# Patient Record
Sex: Male | Born: 1970 | Race: White | Hispanic: No | Marital: Single | Smoking: Current every day smoker
Health system: Southern US, Community
[De-identification: ages and names within clinical notes are randomized; demographics above are authoritative.]

## PROBLEM LIST (undated history)

## (undated) DIAGNOSIS — M810 Age-related osteoporosis without current pathological fracture: Secondary | ICD-10-CM

## (undated) DIAGNOSIS — I10 Essential (primary) hypertension: Secondary | ICD-10-CM

## (undated) DIAGNOSIS — F32A Depression, unspecified: Secondary | ICD-10-CM

## (undated) DIAGNOSIS — F909 Attention-deficit hyperactivity disorder, unspecified type: Secondary | ICD-10-CM

## (undated) DIAGNOSIS — F419 Anxiety disorder, unspecified: Secondary | ICD-10-CM

## (undated) DIAGNOSIS — J302 Other seasonal allergic rhinitis: Secondary | ICD-10-CM

## (undated) DIAGNOSIS — L309 Dermatitis, unspecified: Secondary | ICD-10-CM

## (undated) DIAGNOSIS — K219 Gastro-esophageal reflux disease without esophagitis: Secondary | ICD-10-CM

## (undated) DIAGNOSIS — E785 Hyperlipidemia, unspecified: Secondary | ICD-10-CM

## (undated) DIAGNOSIS — J45909 Unspecified asthma, uncomplicated: Secondary | ICD-10-CM

## (undated) HISTORY — PX: BONE GRAFT HIP ILIAC CREST: SUR159

## (undated) HISTORY — PX: WISDOM TOOTH EXTRACTION: SHX21

## (undated) HISTORY — PX: WRIST FRACTURE SURGERY: SHX121

## (undated) HISTORY — PX: TOTAL HIP ARTHROPLASTY: SHX124

## (undated) HISTORY — PX: OTHER SURGICAL HISTORY: SHX169

---

## 2004-08-08 ENCOUNTER — Ambulatory Visit: Payer: Self-pay | Admitting: Unknown Physician Specialty

## 2005-10-29 ENCOUNTER — Ambulatory Visit: Payer: Self-pay | Admitting: Orthopedic Surgery

## 2007-03-04 ENCOUNTER — Inpatient Hospital Stay: Payer: Self-pay | Admitting: General Practice

## 2007-04-15 ENCOUNTER — Ambulatory Visit: Payer: Self-pay | Admitting: Gastroenterology

## 2008-01-04 ENCOUNTER — Inpatient Hospital Stay: Payer: Self-pay | Admitting: Psychiatry

## 2008-01-11 ENCOUNTER — Ambulatory Visit: Payer: Self-pay | Admitting: Unknown Physician Specialty

## 2008-01-25 ENCOUNTER — Ambulatory Visit: Payer: Self-pay | Admitting: Unknown Physician Specialty

## 2008-02-25 ENCOUNTER — Ambulatory Visit: Payer: Self-pay | Admitting: Unknown Physician Specialty

## 2008-12-27 ENCOUNTER — Emergency Department: Payer: Self-pay | Admitting: Emergency Medicine

## 2010-05-08 ENCOUNTER — Ambulatory Visit: Payer: Self-pay | Admitting: Unknown Physician Specialty

## 2010-05-10 LAB — PATHOLOGY REPORT

## 2013-04-08 ENCOUNTER — Ambulatory Visit: Payer: Self-pay | Admitting: Orthopedic Surgery

## 2015-05-16 ENCOUNTER — Encounter: Payer: Self-pay | Admitting: Emergency Medicine

## 2015-05-16 ENCOUNTER — Emergency Department: Payer: BLUE CROSS/BLUE SHIELD

## 2015-05-16 ENCOUNTER — Inpatient Hospital Stay
Admission: EM | Admit: 2015-05-16 | Discharge: 2015-05-17 | DRG: 312 | Disposition: A | Discharge status: Home / Self Care | Payer: BLUE CROSS/BLUE SHIELD | Attending: Internal Medicine | Admitting: Internal Medicine

## 2015-05-16 DIAGNOSIS — S82209A Unspecified fracture of shaft of unspecified tibia, initial encounter for closed fracture: Secondary | ICD-10-CM | POA: Diagnosis present

## 2015-05-16 DIAGNOSIS — E871 Hypo-osmolality and hyponatremia: Secondary | ICD-10-CM

## 2015-05-16 DIAGNOSIS — Z880 Allergy status to penicillin: Secondary | ICD-10-CM | POA: Diagnosis not present

## 2015-05-16 DIAGNOSIS — S82201A Unspecified fracture of shaft of right tibia, initial encounter for closed fracture: Secondary | ICD-10-CM | POA: Diagnosis present

## 2015-05-16 DIAGNOSIS — E860 Dehydration: Secondary | ICD-10-CM

## 2015-05-16 DIAGNOSIS — Z7982 Long term (current) use of aspirin: Secondary | ICD-10-CM

## 2015-05-16 DIAGNOSIS — Z79891 Long term (current) use of opiate analgesic: Secondary | ICD-10-CM | POA: Diagnosis not present

## 2015-05-16 DIAGNOSIS — E785 Hyperlipidemia, unspecified: Secondary | ICD-10-CM | POA: Diagnosis present

## 2015-05-16 DIAGNOSIS — Z96642 Presence of left artificial hip joint: Secondary | ICD-10-CM | POA: Diagnosis present

## 2015-05-16 DIAGNOSIS — I951 Orthostatic hypotension: Secondary | ICD-10-CM | POA: Diagnosis not present

## 2015-05-16 DIAGNOSIS — F17210 Nicotine dependence, cigarettes, uncomplicated: Secondary | ICD-10-CM | POA: Diagnosis present

## 2015-05-16 DIAGNOSIS — S82401A Unspecified fracture of shaft of right fibula, initial encounter for closed fracture: Secondary | ICD-10-CM | POA: Diagnosis present

## 2015-05-16 DIAGNOSIS — R52 Pain, unspecified: Secondary | ICD-10-CM

## 2015-05-16 DIAGNOSIS — W19XXXA Unspecified fall, initial encounter: Secondary | ICD-10-CM | POA: Diagnosis present

## 2015-05-16 DIAGNOSIS — Z8249 Family history of ischemic heart disease and other diseases of the circulatory system: Secondary | ICD-10-CM | POA: Diagnosis not present

## 2015-05-16 DIAGNOSIS — Z8042 Family history of malignant neoplasm of prostate: Secondary | ICD-10-CM | POA: Diagnosis not present

## 2015-05-16 DIAGNOSIS — I10 Essential (primary) hypertension: Secondary | ICD-10-CM | POA: Diagnosis present

## 2015-05-16 DIAGNOSIS — Z79899 Other long term (current) drug therapy: Secondary | ICD-10-CM | POA: Diagnosis not present

## 2015-05-16 DIAGNOSIS — F909 Attention-deficit hyperactivity disorder, unspecified type: Secondary | ICD-10-CM | POA: Diagnosis present

## 2015-05-16 DIAGNOSIS — M810 Age-related osteoporosis without current pathological fracture: Secondary | ICD-10-CM | POA: Diagnosis present

## 2015-05-16 HISTORY — DX: Attention-deficit hyperactivity disorder, unspecified type: F90.9

## 2015-05-16 HISTORY — DX: Hyperlipidemia, unspecified: E78.5

## 2015-05-16 HISTORY — DX: Essential (primary) hypertension: I10

## 2015-05-16 HISTORY — DX: Age-related osteoporosis without current pathological fracture: M81.0

## 2015-05-16 LAB — COMPREHENSIVE METABOLIC PANEL
ALBUMIN: 3.7 g/dL (ref 3.5–5.0)
ALT: 38 U/L (ref 17–63)
ANION GAP: 4 — AB (ref 5–15)
AST: 29 U/L (ref 15–41)
Alkaline Phosphatase: 59 U/L (ref 38–126)
BILIRUBIN TOTAL: 0.4 mg/dL (ref 0.3–1.2)
BUN: 15 mg/dL (ref 6–20)
CO2: 23 mmol/L (ref 22–32)
Calcium: 8.1 mg/dL — ABNORMAL LOW (ref 8.9–10.3)
Chloride: 101 mmol/L (ref 101–111)
Creatinine, Ser: 1.21 mg/dL (ref 0.61–1.24)
GFR calc non Af Amer: >60 mL/min (ref >60)
GLUCOSE: 131 mg/dL — AB (ref 65–99)
POTASSIUM: 3.9 mmol/L (ref 3.5–5.1)
SODIUM: 128 mmol/L — AB (ref 135–145)
TOTAL PROTEIN: 6.7 g/dL (ref 6.5–8.1)

## 2015-05-16 LAB — CBC WITH DIFFERENTIAL/PLATELET
BASOS PCT: 0 %
Basophils Absolute: 0 10*3/uL (ref 0–0.1)
EOS ABS: 0 10*3/uL (ref 0–0.7)
Eosinophils Relative: 1 %
HEMATOCRIT: 44 % (ref 40.0–52.0)
HEMOGLOBIN: 14.9 g/dL (ref 13.0–18.0)
Lymphocytes Relative: 8 %
Lymphs Abs: 0.5 10*3/uL — ABNORMAL LOW (ref 1.0–3.6)
MCH: 31.5 pg (ref 26.0–34.0)
MCHC: 33.8 g/dL (ref 32.0–36.0)
MCV: 93.4 fL (ref 80.0–100.0)
MONOS PCT: 11 %
Monocytes Absolute: 0.7 10*3/uL (ref 0.2–1.0)
NEUTROS ABS: 5.1 10*3/uL (ref 1.4–6.5)
NEUTROS PCT: 80 %
Platelets: 161 10*3/uL (ref 150–440)
RBC: 4.72 MIL/uL (ref 4.40–5.90)
RDW: 13.8 % (ref 11.5–14.5)
WBC: 6.4 10*3/uL (ref 3.8–10.6)

## 2015-05-16 LAB — RAPID INFLUENZA A&B ANTIGENS (ARMC ONLY)
INFLUENZA A (ARMC): NEGATIVE
INFLUENZA B (ARMC): NEGATIVE

## 2015-05-16 LAB — INFLUENZA PANEL BY PCR (TYPE A & B)
H1N1 flu by pcr: NOT DETECTED
Influenza A By PCR: POSITIVE — AB
Influenza B By PCR: NEGATIVE

## 2015-05-16 LAB — LIPASE, BLOOD: Lipase: 20 U/L (ref 11–51)

## 2015-05-16 MED ORDER — POLYETHYLENE GLYCOL 3350 17 G PO PACK: 17.0000 g | PACK | Freq: Every day | ORAL | Status: DC | PRN

## 2015-05-16 MED ORDER — ONDANSETRON HCL 4 MG/2ML IJ SOLN
4.0000 mg | Freq: Four times a day (QID) | INTRAMUSCULAR | Status: DC | PRN
Start: 2015-05-16 — End: 2015-05-17

## 2015-05-16 MED ORDER — ACETAMINOPHEN 650 MG RE SUPP: 650.0000 mg | Freq: Four times a day (QID) | RECTAL | Status: DC | PRN

## 2015-05-16 MED ORDER — ENOXAPARIN SODIUM 40 MG/0.4ML ~~LOC~~ SOLN: 40.0000 mg | SUBCUTANEOUS | Status: DC

## 2015-05-16 MED ORDER — HYDROMORPHONE HCL 1 MG/ML IJ SOLN
1.0000 mg | INTRAMUSCULAR | Status: DC | PRN
  Administered 2015-05-16 – 2015-05-17 (×3): 1 mg via INTRAVENOUS
  Filled 2015-05-16 (×3): qty 1

## 2015-05-16 MED ORDER — ALPRAZOLAM 0.5 MG PO TABS
0.5000 mg | ORAL_TABLET | Freq: Three times a day (TID) | ORAL | Status: DC | PRN
  Administered 2015-05-17: 0.5 mg via ORAL
  Filled 2015-05-16: qty 1

## 2015-05-16 MED ORDER — HYDROMORPHONE HCL 1 MG/ML IJ SOLN
1.0000 mg | Freq: Once | INTRAMUSCULAR | Status: AC
  Administered 2015-05-16: 1 mg via INTRAVENOUS
  Filled 2015-05-16: qty 1

## 2015-05-16 MED ORDER — SODIUM CHLORIDE 0.9 % IV BOLUS (SEPSIS)
1000.0000 mL | Freq: Once | INTRAVENOUS | Status: AC
  Administered 2015-05-16: 1000 mL via INTRAVENOUS

## 2015-05-16 MED ORDER — MOMETASONE FURO-FORMOTEROL FUM 100-5 MCG/ACT IN AERO
2.0000 | INHALATION_SPRAY | Freq: Two times a day (BID) | RESPIRATORY_TRACT | Status: DC
  Administered 2015-05-16 – 2015-05-17 (×2): 2 via RESPIRATORY_TRACT
  Filled 2015-05-16: qty 8.8

## 2015-05-16 MED ORDER — OMEGA-3-ACID ETHYL ESTERS 1 G PO CAPS
1.0000 g | ORAL_CAPSULE | Freq: Every day | ORAL | Status: DC
Start: 2015-05-17 — End: 2015-05-17
  Administered 2015-05-17: 1 g via ORAL
  Filled 2015-05-16: qty 1

## 2015-05-16 MED ORDER — NICOTINE 21 MG/24HR TD PT24
21.0000 mg | MEDICATED_PATCH | Freq: Every day | TRANSDERMAL | Status: DC
  Administered 2015-05-16 – 2015-05-17 (×2): 21 mg via TRANSDERMAL
  Filled 2015-05-16 (×2): qty 1

## 2015-05-16 MED ORDER — HYDROCODONE-ACETAMINOPHEN 5-325 MG PO TABS
1.0000 | ORAL_TABLET | ORAL | Status: DC | PRN
  Administered 2015-05-16 – 2015-05-17 (×4): 2 via ORAL
  Filled 2015-05-16 (×4): qty 2

## 2015-05-16 MED ORDER — SODIUM CHLORIDE 0.9 % IV SOLN
INTRAVENOUS | Status: DC
  Administered 2015-05-16 (×2): via INTRAVENOUS

## 2015-05-16 MED ORDER — AMPHETAMINE-DEXTROAMPHET ER 5 MG PO CP24: 20.0000 mg | ORAL_CAPSULE | Freq: Every day | ORAL | Status: DC

## 2015-05-16 MED ORDER — ACETAMINOPHEN 325 MG PO TABS: 650.0000 mg | ORAL_TABLET | Freq: Four times a day (QID) | ORAL | Status: DC | PRN

## 2015-05-16 MED ORDER — SIMVASTATIN 20 MG PO TABS
20.0000 mg | ORAL_TABLET | Freq: Every day | ORAL | Status: DC
  Administered 2015-05-17: 20 mg via ORAL
  Filled 2015-05-16: qty 1

## 2015-05-16 MED ORDER — ONDANSETRON HCL 4 MG PO TABS: 4.0000 mg | ORAL_TABLET | Freq: Four times a day (QID) | ORAL | Status: DC | PRN

## 2015-05-16 MED ORDER — ALENDRONATE SODIUM 10 MG PO TABS: 10.0000 mg | ORAL_TABLET | Freq: Every day | ORAL | Status: DC

## 2015-05-16 MED ORDER — DOCUSATE SODIUM 100 MG PO CAPS
100.0000 mg | ORAL_CAPSULE | Freq: Two times a day (BID) | ORAL | Status: DC
  Administered 2015-05-17: 100 mg via ORAL
  Filled 2015-05-16: qty 1

## 2015-05-16 MED ORDER — ONDANSETRON HCL 4 MG/2ML IJ SOLN
4.0000 mg | Freq: Once | INTRAMUSCULAR | Status: AC
  Administered 2015-05-16: 4 mg via INTRAVENOUS
  Filled 2015-05-16: qty 2

## 2015-05-16 NOTE — ED Provider Notes (Signed)
[facility] Emergency Department Provider Note  ____________________________________________  Time seen: 12:45 PM on arrival by EMS  I have reviewed the triage vital signs and the nursing notes.   HISTORY  Chief Complaint Leg Injury    HPI Theodore Garcia is a 45 y.o. male who complains of syncope and right lower leg pain today. He reports that he has had vomiting and diarrhea and an inability to eat or drink anything for the past 4 or 5 days. This morning he was getting out of bed to go to the bathroom, and as he was turning while standing on his feet he passed out. He woke up on the ground and had severe right leg pain and inability to move the foot. When EMS found him, initial blood pressure was about 80/60. This improved to 110/70 with IV fluids en route.  The leg pain is 10 out of 10, worse with movement. Denies numbness or tingling in the foot.   Past Medical History  Diagnosis Date  . Hypertension      There are no active problems to display for this patient.    Past Surgical History  Procedure Laterality Date  . Bone graph    . Total hip arthroplasty      Left hip     No current outpatient prescriptions on file.   Allergies Penicillins   No family history on file.  Social History Social History  Substance Use Topics  . Smoking status: Unknown If Ever Smoked  . Smokeless tobacco: Not on file  . Alcohol Use: Not on file   no current alcohol or tobacco use.  Review of Systems  Constitutional:   No fever or chills. No weight changes Eyes:   No blurry vision or double vision.  ENT:   No sore throat.  Cardiovascular:   No chest pain. Respiratory:   No dyspnea or cough. Gastrointestinal:   Generalized abdominal pain with vomiting and diarrhea..  No BRBPR or melena. Genitourinary:   Negative for dysuria or difficulty urinating. Musculoskeletal:   Negative for back pain. Right lower leg pain as above. Skin:   Negative for  rash. Neurological:   Negative for headaches, focal weakness or numbness. Positive syncope. Psychiatric:  No anxiety or depression.   Endocrine:  No changes in energy or sleep difficulty.  10-point ROS otherwise negative.  ____________________________________________   PHYSICAL EXAM:  VITAL SIGNS: ED Triage Vitals  Enc Vitals Group     BP 05/16/15 1258 109/70 mmHg     Pulse Rate 05/16/15 1258 69     Resp 05/16/15 1258 18     Temp 05/16/15 1258 97.5 F (36.4 C)     Temp Source 05/16/15 1258 Oral     SpO2 05/16/15 1258 100 %     Weight --      Height --      Head Cir --      Peak Flow --      Pain Score 05/16/15 1259 10     Pain Loc --      Pain Edu? --      Excl. in GC? --     Vital signs reviewed, nursing assessments reviewed.   Constitutional:   Alert and oriented. Very uncomfortable.. Eyes:   No scleral icterus. No conjunctival pallor. PERRL. EOMI ENT   Head:   Normocephalic and atraumatic.   Nose:   No congestion/rhinnorhea. No septal hematoma   Mouth/Throat:   Dry mucous membranes, no pharyngeal erythema. No peritonsillar  mass.    Neck:   No stridor. No SubQ emphysema. No meningismus. Hematological/Lymphatic/Immunilogical:   No cervical lymphadenopathy. Cardiovascular:   RRR. Symmetric bilateral radial, PT, and DP pulses. The lower extremity pulses are thready but symmetric between the injured and an injured legs. Capillary refill is sluggish at about 4 or 5 seconds laterally. No heart murmurs.  Respiratory:   Normal respiratory effort without tachypnea nor retractions. Breath sounds are clear and equal bilaterally. No wheezes/rales/rhonchi. Gastrointestinal:   Soft and nontender. Non distended.  No rebound, rigidity, or guarding. Genitourinary:   deferred Musculoskeletal:   Lateral rotation of the right foot and ankle compared to the knee and thigh. There is severe bony tenderness throughout the tib-fib area. Neurologic:   Normal speech and  language.  CN 2-10 normal. Motor grossly intact. Able to wiggle toes on the right foot Intact sensation No gross focal neurologic deficits are appreciated.  Skin:    Skin is warm, dry and intact. No rash noted.  No petechiae, purpura, or bullae. Psychiatric:   Mood and affect are normal. ____________________________________________    LABS (pertinent positives/negatives) (all labs ordered are listed, but only abnormal results are displayed) Labs Reviewed  COMPREHENSIVE METABOLIC PANEL - Abnormal; Notable for the following:    Sodium 128 (*)    Glucose, Bld 131 (*)    Calcium 8.1 (*)    Anion gap 4 (*)    All other components within normal limits  CBC WITH DIFFERENTIAL/PLATELET - Abnormal; Notable for the following:    Lymphs Abs 0.5 (*)    All other components within normal limits  RAPID INFLUENZA A&B ANTIGENS ([facility] ONLY)  LIPASE, BLOOD   ____________________________________________   EKG    ____________________________________________    RADIOLOGY  X-ray right tib-fib shows a comminuted displaced fracture of the proximal right fibula and a displaced spiral fracture of the distal right tibia.  ____________________________________________   PROCEDURES SPLINT APPLICATION Date/Time: 3:10 PM Authorized by: Scotty Court, Aaniyah Strohm Consent: Verbal consent obtained. Risks and benefits: risks, benefits and alternatives were discussed Consent given by: patient Splint applied by: Myself and orthopedic technician Location details: Right leg  Splint type: Long leg 3 sided  Supplies used: Ortho-Glass  Post-procedure: The splinted body part was neurovascularly unchanged following the procedure. Patient tolerance: Patient tolerated the procedure well with no immediate complications.     ____________________________________________   INITIAL IMPRESSION / ASSESSMENT AND PLAN / ED COURSE  Pertinent labs & imaging results that were available during my care of the patient were  reviewed by me and considered in my medical decision making (see chart for details).  Patient presents with hypotension secondary to severe dehydration from a vomiting and diarrheal illness, likely viral. We'll check a flu test will giving IV fluids. He does seem to be volume responsive. Low suspicion for sepsis or shock at this time. This also appears to cause syncope due to orthostatic hypotension today. It while he was passing out he sustained a displaced tib-fib fracture of the right leg. I discussed the injury with Dr. Hyacinth Meeker who is on-call for orthopedics at 2:15 PM. He states that this can be splinted and followed up in clinic and he like to see the patient in 2 days on Friday. On reassessing the patient at 2:40 PM, after his initial liter bolus of saline and had finished, blood pressure had again dropped to 80/60 and the patient still had severe 10 out of 10 pain. We'll give a repeat dose of Dilaudid as well as another  liter of saline bolus.  Due to the persistent hypotension secondary to dehydration, comminuted by hyponatremia on labs, patient living alone, and his inability to care for himself if he is not able to safely ambulate with crutches, I discussed the case with the hospitalist for admission. We'll continue giving Dilaudid as needed for his intractable leg pain secondary to acute fractures. Initial fracture management provided with pain control and splinting.     ____________________________________________   FINAL CLINICAL IMPRESSION(S) / ED DIAGNOSES  Final diagnoses:  Orthostatic hypotension  Severe dehydration  Hyponatremia  Intractable pain  Closed fracture of right tibia and fibula, initial encounter      Sharman CheekPhillip Humbert Morozov, MD 05/16/15 1513

## 2015-05-16 NOTE — ED Notes (Signed)
Per EMS, patient comes from home. He has been sick since Saturday. Today he got OOB and "the next thing I knew I was on the ground". Patient has hx of avascular necrosis of right leg. EMS noticed an obvious deformity to the right skin and placed patient in a splint. Lower extremity pulses are thready but symmetrical on both sides. Cap refill is sluggish but also symmetrical. Patient rates his pain 10/10.

## 2015-05-16 NOTE — Progress Notes (Signed)
PHARMACIST - PHYSICIAN COMMUNICATION  CONCERNING: P&T Medication Policy Regarding Oral Bisphosphonates  RECOMMENDATION: Your order for alendronate (Fosamax), ibandronate (Boniva), or risedronate (Actonel) has been discontinued at this time.  If the patient's post-hospital medical condition warrants safe use of this class of drugs, please resume the pre-hospital regimen upon discharge.  DESCRIPTION:  Alendronate (Fosamax), ibandronate (Boniva), and risedronate (Actonel) can cause severe esophageal erosions in patients who are unable to remain upright at least 30 minutes after taking this medication.   Since brief interruptions in therapy are thought to have minimal impact on bone mineral density, the Pharmacy & Therapeutics Committee has established that bisphosphonate orders should be routinely discontinued during hospitalization.   To override this safety policy and permit administration of Boniva, Fosamax, or Actonel in the hospital, prescribers must write "DO NOT HOLD" in the comments section when placing the order for this class of medications.  Jodelle RedMary M [facility] 05/16/15 6:12 PM  .

## 2015-05-16 NOTE — H&P (Signed)
[facility] [place] [facility]   PATIENT NAME: Theodore PeekCharles Garcia    MR#:  [id number]  DATE OF BIRTH:  01/14/1971  DATE OF ADMISSION:  05/16/2015  PRIMARY CARE PHYSICIAN: Dr. Jerl MinaJames Hedrick  REQUESTING/REFERRING PHYSICIAN: Dr. Sharman CheekPhillip Stafford  CHIEF COMPLAINT:   Chief Complaint  Patient presents with  . Leg Injury    Right    HISTORY OF PRESENT ILLNESS:  Theodore Garcia  is a 45 y.o. 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 Patient said he started feeling sick about 3-4 days ago. Started with chills, body aches and nausea. He had some sore throat and nasal congestion. He has been not eating and drinking well since then. Since been feeling weak unable to get out of bed for the last couple of days. Today was the first day that he felt little stronger and wanted to get up and take his medications and, as soon as he stood up moving he felt dizzy and passed out. When he woke up he noticed that his right leg was extremely painful and swollen. He still dragged himself to the bathroom because he was having diarrhea and had a second syncopal episode over there. His mom who came to check on him brought into the hospital. Was noted to be hypotensive in the emergency room, responding to IV fluids. Also x-ray showing right tibial and fibular fracture.  PAST MEDICAL HISTORY:   Past Medical History  Diagnosis Date  . Hypertension   . Hyperlipidemia   . ADHD (attention deficit hyperactivity disorder)   . Osteoporosis     PAST SURGICAL HISTORY:   Past Surgical History  Procedure Laterality Date  . Total hip arthroplasty      Left hip  . Bone graft hip iliac crest      left hip  . Craniotomy      for closed fontanella  . Wrist fracture surgery      Right wrist    SOCIAL HISTORY:    Social History  Substance Use Topics  . Smoking status: Current Every Day Smoker -- 1.00 packs/day    Types: Cigarettes  . Smokeless tobacco: Not on file  . Alcohol Use: 0.0 oz/week    0 Standard drinks or equivalent per week     Comment: liquor 2-3 times/day- last drink 4 days ago    FAMILY HISTORY:   Family History  Problem Relation Age of Onset  . CAD Mother     had CABG  . Prostate cancer Maternal Grandfather     DRUG ALLERGIES:   Allergies  Allergen Reactions  . Penicillins Swelling, Rash and Other (See Comments)    Reaction:  Tongue swelling  Has patient had a PCN reaction causing immediate rash, facial/tongue/throat swelling, SOB or lightheadedness with hypotension: Yes Has patient had a PCN reaction causing severe rash involving mucus membranes or skin necrosis: No Has patient had a PCN reaction that required hospitalization No Has patient had a PCN reaction occurring within the last 10 years: No If all of the above answers are "NO", then may proceed with Cephalosporin use.    REVIEW OF SYSTEMS:   Review of Systems  Constitutional: Positive for malaise/fatigue. Negative for fever, chills and weight loss.  HENT: Negative for ear discharge, ear pain, hearing loss and nosebleeds.   Eyes: Negative for blurred  vision, double vision and photophobia.  Respiratory: Negative for cough, hemoptysis, shortness of breath and wheezing.   Cardiovascular: Negative for chest pain, palpitations, orthopnea and leg swelling.  Gastrointestinal: Positive for nausea. Negative for heartburn, vomiting, abdominal pain, diarrhea, constipation and melena.  Genitourinary: Negative for dysuria, urgency and frequency.  Musculoskeletal: Positive for myalgias and joint pain. Negative for back pain and neck pain.  Skin: Negative for rash.  Neurological: Negative for dizziness, tingling, tremors, sensory change, speech change, focal weakness and headaches.  Endo/Heme/Allergies: Does not  bruise/bleed easily.  Psychiatric/Behavioral: Negative for depression.    MEDICATIONS AT HOME:   Prior to Admission medications   Medication Sig Start Date End Date Taking? Authorizing Provider  alendronate (FOSAMAX) 10 MG tablet Take 10 mg by mouth daily with lunch. Take with a full glass of water on an empty stomach.   Yes Historical Provider, MD  ALPRAZolam Prudy Feeler) 0.5 MG tablet Take 0.5 mg by mouth 3 (three) times daily as needed for anxiety or sleep.   Yes Historical Provider, MD  amphetamine-dextroamphetamine (ADDERALL XR) 20 MG 24 hr capsule Take 20 mg by mouth daily at 12 noon.   Yes Historical Provider, MD  amphetamine-dextroamphetamine (ADDERALL) 10 MG tablet Take 10 mg by mouth daily with lunch.   Yes Historical Provider, MD  Aspirin-Salicylamide-Caffeine (BC HEADACHE PO) Take 1 packet by mouth as needed (for headache).   Yes Historical Provider, MD  budesonide-formoterol (SYMBICORT) 80-4.5 MCG/ACT inhaler Inhale 2 puffs into the lungs 2 (two) times daily.   Yes Historical Provider, MD  EPINEPHrine (EPIPEN 2-PAK) 0.3 mg/0.3 mL IJ SOAJ injection Inject 0.3 mg into the muscle once as needed (for severe allergic reaction).   Yes Historical Provider, MD  lisinopril (PRINIVIL,ZESTRIL) 20 MG tablet Take 20 mg by mouth daily at 12 noon.   Yes Historical Provider, MD  omega-3 acid ethyl esters (LOVAZA) 1 g capsule Take 1 g by mouth daily at 12 noon.   Yes Historical Provider, MD  simvastatin (ZOCOR) 20 MG tablet Take 20 mg by mouth daily at 12 noon.   Yes Historical Provider, MD  Tuberculin-Allergy Syringes (B-D ALLERGY SYRINGE 1CC/28G) 28G X 1/2" 1 ML MISC 1 Dose by Does not apply route once a week.   Yes Historical Provider, MD      VITAL SIGNS:  Blood pressure 109/70, pulse 65, temperature 97.5 F (36.4 C), temperature source Oral, resp. rate 12, SpO2 96 %.  PHYSICAL EXAMINATION:   Physical Exam  GENERAL:  45 y.o.-year-old patient lying in the bed with no acute distress.  EYES:  Pupils equal, round, reactive to light and accommodation. No scleral icterus. Extraocular muscles intact. Erythematous conjunctiva HEENT: Head atraumatic, normocephalic. Oropharynx and nasopharynx clear. Very dry mucous membranes NECK:  Supple, no jugular venous distention. No thyroid enlargement, no tenderness.  LUNGS: Normal breath sounds bilaterally, no wheezing, rales,rhonchi or crepitation. No use of accessory muscles of respiration.  CARDIOVASCULAR: S1, S2 normal. No murmurs, rubs, or gallops.  ABDOMEN: Soft, nontender, nondistended. Bowel sounds present. No organomegaly or mass.  EXTREMITIES: Right leg in a long leg soft splint starting mid thigh to the foot. No pedal edema, cyanosis, or clubbing.  NEUROLOGIC: Cranial nerves II through XII are intact. Muscle strength 5/5 in all extremities except right leg due to pain. Sensation intact. Gait not checked.  PSYCHIATRIC: The patient is alert and oriented x 3.  SKIN: No obvious rash, lesion, or ulcer.   LABORATORY PANEL:   CBC  Recent Labs Lab 05/16/15 1343  WBC 6.4  HGB 14.9  HCT 44.0  PLT 161   ------------------------------------------------------------------------------------------------------------------  Chemistries   Recent Labs Lab 05/16/15 1343  NA 128*  K 3.9  CL 101  CO2 23  GLUCOSE 131*  BUN 15  CREATININE 1.21  CALCIUM 8.1*  AST 29  ALT 38  ALKPHOS 59  BILITOT 0.4   ------------------------------------------------------------------------------------------------------------------  Cardiac Enzymes No results for input(s): TROPONINI in the last 168 hours. ------------------------------------------------------------------------------------------------------------------  RADIOLOGY:  Dg Tibia/fibula Right  05/16/2015  CLINICAL DATA:  Flu symptoms for 1 week. Passed out today. Leg pain. EXAM: RIGHT TIBIA AND FIBULA - 2 VIEW COMPARISON:  Right femur 12/27/2008 FINDINGS: Comminuted proximal right fibular  diaphysis fracture with 7 mm of lateral displacement. Spiral fracture of the distal right tibial diaphysis with 9 mm of lateral displacement. No other fracture or dislocation. Serpiginous sclerotic lesion in the distal femoral metaphysis without endosteal scalloping, bone destruction or soft tissue mass most consistent with a bone infarct. IMPRESSION: Comminuted proximal right fibular diaphysis fracture with 7 mm of lateral displacement. Spiral fracture of the distal right tibial diaphysis with 9 mm of lateral displacement. Electronically Signed   By: Elige Ko   On: 05/16/2015 13:28    EKG:   Orders placed or performed during the hospital encounter of 05/16/15  . ED EKG  . ED EKG    IMPRESSION AND PLAN:   Deni Lefever  is a 45 y.o. 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  #1 syncope-Vasovagal and also likely orthostatic from hypotension. -No further testing needs to be done at this time. IV fluids and monitor  #2 hypotension-Hypovolemic. Poor by mouth intake over the last few days. IV hydration -Monitor blood pressure. Hold antihypertensives  #3 right leg tibial/fibular fracture-Orthopedics was contacted from the emergency room and they recommended outpatient follow-up and long splint. -Soft splint in place. Likely nonweightbearing. Ortho consult in the hospital. -Pain management. Physical therapy consult  #4 hypertension- Hold his home medications as patient is hypotensive now  #5 osteoporosis-continue Fosamax. 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 Patient on Adderall  #7 DVT prophylaxis-since appears to be nonsurgical at this time, we'll start Lovenox.    All the records are reviewed and case discussed with ED  provider. Management plans discussed with the patient, family and they are in agreement.  CODE STATUS: Full code  TOTAL TIME TAKING CARE OF THIS PATIENT: 50 minutes.    Enid Baas M.D on 05/16/2015 at 4:09 PM  Between 7am to 6pm - Pager - [phone]  After 6pm go to [email or web address] - password [email or web address] [facility]  [facility]  Office  [phone]  CC: Primary care physician; No primary care provider on file.

## 2015-05-16 NOTE — ED Notes (Signed)
Patient transported to x-ray. ?

## 2015-05-16 NOTE — Progress Notes (Signed)
Dr. Anne HahnWillis notified that pr is positive for FLU A. Droplet precautions continued. No new orders.

## 2015-05-17 ENCOUNTER — Other Ambulatory Visit: Payer: Self-pay | Admitting: Specialist

## 2015-05-17 LAB — BASIC METABOLIC PANEL
Anion gap: 5 (ref 5–15)
BUN: 12 mg/dL (ref 6–20)
CALCIUM: 7.9 mg/dL — AB (ref 8.9–10.3)
CO2: 27 mmol/L (ref 22–32)
CREATININE: 0.92 mg/dL (ref 0.61–1.24)
Chloride: 101 mmol/L (ref 101–111)
Glucose, Bld: 106 mg/dL — ABNORMAL HIGH (ref 65–99)
Potassium: 3.4 mmol/L — ABNORMAL LOW (ref 3.5–5.1)
SODIUM: 133 mmol/L — AB (ref 135–145)

## 2015-05-17 LAB — URINE DRUG SCREEN, QUALITATIVE (ARMC ONLY)
Amphetamines, Ur Screen: NOT DETECTED
BENZODIAZEPINE, UR SCRN: POSITIVE — AB
Barbiturates, Ur Screen: NOT DETECTED
Cannabinoid 50 Ng, Ur ~~LOC~~: POSITIVE — AB
Cocaine Metabolite,Ur ~~LOC~~: NOT DETECTED
MDMA (Ecstasy)Ur Screen: NOT DETECTED
Methadone Scn, Ur: NOT DETECTED
Opiate, Ur Screen: POSITIVE — AB
PHENCYCLIDINE (PCP) UR S: NOT DETECTED
TRICYCLIC, UR SCREEN: NOT DETECTED

## 2015-05-17 LAB — CBC
HCT: 38.7 % — ABNORMAL LOW (ref 40.0–52.0)
Hemoglobin: 13.1 g/dL (ref 13.0–18.0)
MCH: 31.7 pg (ref 26.0–34.0)
MCHC: 33.8 g/dL (ref 32.0–36.0)
MCV: 93.9 fL (ref 80.0–100.0)
PLATELETS: 160 10*3/uL (ref 150–440)
RBC: 4.12 MIL/uL — ABNORMAL LOW (ref 4.40–5.90)
RDW: 13.6 % (ref 11.5–14.5)
WBC: 4.4 10*3/uL (ref 3.8–10.6)

## 2015-05-17 MED ORDER — ALUM & MAG HYDROXIDE-SIMETH 200-200-20 MG/5ML PO SUSP
30.0000 mL | Freq: Four times a day (QID) | ORAL | Status: DC | PRN
  Administered 2015-05-17 (×2): 30 mL via ORAL
  Filled 2015-05-17 (×2): qty 30

## 2015-05-17 MED ORDER — ASPIRIN EC 325 MG PO TBEC: 325.0000 mg | DELAYED_RELEASE_TABLET | Freq: Every day | ORAL | Status: DC

## 2015-05-17 MED ORDER — OXYCODONE HCL 5 MG PO TABS: 5.0000 mg | ORAL_TABLET | ORAL | Status: DC | PRN

## 2015-05-17 NOTE — Consult Note (Signed)
ORTHOPAEDIC CONSULTATION  REQUESTING PHYSICIAN: Enedina Finner, MD  Chief Complaint: Right leg pain   HPI: Theodore Garcia is a 45 y.o. male who complains of who has been ill with the flu all week and became weak and dehydrated.  Stood up yesterday and collapsed injuring the right leg. Exam and x-rays in the ER showed a displaced and rotated right tibial/fibula fracture.  I recommended splinting and delayed rodding of the fracture to allow him to recover medically.  He had business to attend to next week and he would prefer to wait on surgery also.  The risks and benefits of surgery vs casting were discussed at length with him and his mother.  Thery agree with this plan and we will schedule him for surgery 05/23/15.   Past Medical History  Diagnosis Date  . Hypertension   . Hyperlipidemia   . ADHD (attention deficit hyperactivity disorder)   . Osteoporosis    Past Surgical History  Procedure Laterality Date  . Total hip arthroplasty      Left hip  . Bone graft hip iliac crest      left hip  . Craniotomy      for closed fontanella  . Wrist fracture surgery      Right wrist   Social History   Social History  . Marital Status: Single    Spouse Name: N/A  . Number of Children: N/A  . Years of Education: N/A   Social History Main Topics  . Smoking status: Current Every Day Smoker -- 1.00 packs/day    Types: Cigarettes  . Smokeless tobacco: None  . Alcohol Use: 0.0 oz/week    0 Standard drinks or equivalent per week     Comment: liquor 2-3 times/day- last drink 4 days ago  . Drug Use: Yes     Comment: inhales marijuana- last dip 4 days ago  . Sexual Activity: Not Asked   Other Topics Concern  . None   Social History Narrative   Lives alone.   Family History  Problem Relation Age of Onset  . CAD Mother     had CABG  . Prostate cancer Maternal Grandfather    Allergies  Allergen Reactions  . Penicillins Swelling, Rash and Other (See Comments)    Reaction:  Tongue  swelling  Has patient had a PCN reaction causing immediate rash, facial/tongue/throat swelling, SOB or lightheadedness with hypotension: Yes Has patient had a PCN reaction causing severe rash involving mucus membranes or skin necrosis: No Has patient had a PCN reaction that required hospitalization No Has patient had a PCN reaction occurring within the last 10 years: No If all of the above answers are "NO", then may proceed with Cephalosporin use.   Prior to Admission medications   Medication Sig Start Date End Date Taking? Authorizing Provider  alendronate (FOSAMAX) 10 MG tablet Take 10 mg by mouth daily with lunch. Take with a full glass of water on an empty stomach.   Yes Historical Provider, MD  ALPRAZolam Prudy Feeler) 0.5 MG tablet Take 0.5 mg by mouth 3 (three) times daily as needed for anxiety or sleep.   Yes Historical Provider, MD  amphetamine-dextroamphetamine (ADDERALL XR) 20 MG 24 hr capsule Take 20 mg by mouth daily at 12 noon.   Yes Historical Provider, MD  amphetamine-dextroamphetamine (ADDERALL) 10 MG tablet Take 10 mg by mouth daily with lunch.   Yes Historical Provider, MD  Aspirin-Salicylamide-Caffeine (BC HEADACHE PO) Take 1 packet by mouth as needed (for headache).  Yes Historical Provider, MD  budesonide-formoterol (SYMBICORT) 80-4.5 MCG/ACT inhaler Inhale 2 puffs into the lungs 2 (two) times daily.   Yes Historical Provider, MD  EPINEPHrine (EPIPEN 2-PAK) 0.3 mg/0.3 mL IJ SOAJ injection Inject 0.3 mg into the muscle once as needed (for severe allergic reaction).   Yes Historical Provider, MD  lisinopril (PRINIVIL,ZESTRIL) 20 MG tablet Take 20 mg by mouth daily at 12 noon.   Yes Historical Provider, MD  omega-3 acid ethyl esters (LOVAZA) 1 g capsule Take 1 g by mouth daily at 12 noon.   Yes Historical Provider, MD  simvastatin (ZOCOR) 20 MG tablet Take 20 mg by mouth daily at 12 noon.   Yes Historical Provider, MD  Tuberculin-Allergy Syringes (B-D ALLERGY SYRINGE 1CC/28G) 28G X  1/2" 1 ML MISC 1 Dose by Does not apply route once a week.   Yes Historical Provider, MD  aspirin EC 325 MG tablet Take 1 tablet (325 mg total) by mouth daily. Start taking daily but stop after your dose on Sunday march 26 th for your surgery on next wednesday 05/17/15   Enedina FinnerSona Patel, MD  oxyCODONE (ROXICODONE) 5 MG immediate release tablet Take 1 tablet (5 mg total) by mouth every 4 (four) hours as needed for severe pain. 05/17/15   Enedina FinnerSona Patel, MD   Dg Tibia/fibula Right  05/16/2015  CLINICAL DATA:  Flu symptoms for 1 week. Passed out today. Leg pain. EXAM: RIGHT TIBIA AND FIBULA - 2 VIEW COMPARISON:  Right femur 12/27/2008 FINDINGS: Comminuted proximal right fibular diaphysis fracture with 7 mm of lateral displacement. Spiral fracture of the distal right tibial diaphysis with 9 mm of lateral displacement. No other fracture or dislocation. Serpiginous sclerotic lesion in the distal femoral metaphysis without endosteal scalloping, bone destruction or soft tissue mass most consistent with a bone infarct. IMPRESSION: Comminuted proximal right fibular diaphysis fracture with 7 mm of lateral displacement. Spiral fracture of the distal right tibial diaphysis with 9 mm of lateral displacement. Electronically Signed   By: Elige KoHetal  Patel   On: 05/16/2015 13:28    Positive ROS: All other systems have been reviewed and were otherwise negative with the exception of those mentioned in the HPI and as above.  Physical Exam: General: Alert, no acute distress Cardiovascular: No pedal edema Respiratory: No cyanosis, no use of accessory musculature GI: No organomegaly, abdomen is soft and non-tender Skin: No lesions in the area of chief complaint Neurologic: Sensation intact distally Psychiatric: Patient is competent for consent with normal mood and affect Lymphatic: No axillary or cervical lymphadenopathy  MUSCULOSKELETAL: Right leg encased in posterior and medial/lateral splints.  csm good.  Right lower tibia and foot  externally rotated 45*.  Knee and hip normal.  No other injuries noted.  Splints left intact.   Assessment: Displaced right tibia/fibula fractures  Plan: Right tibial rodding next week May go home now and elevate the leg.     Valinda HoarMILLER,Valentine Kuechle E, MD [phone]   05/17/2015 5:02 PM

## 2015-05-17 NOTE — Care Management (Signed)
Standard tall walker requested from [facility]. They only have rolling walkers in stock so this will have to be shipped to patient's address- takes about 24-72 hours (by Saturday) depending on when order is put in system.

## 2015-05-17 NOTE — Discharge Summary (Signed)
[facility] - [place] at [facility]   PATIENT NAME: Theodore Garcia    MR#:  [id number]  DATE OF BIRTH:  September 08, 1970  DATE OF ADMISSION:  05/16/2015 ADMITTING PHYSICIAN: Enid Baas, MD  DATE OF DISCHARGE: 05/17/15  PRIMARY CARE PHYSICIAN: No primary care provider on file.    ADMISSION DIAGNOSIS:  Orthostatic hypotension [I95.1] Hyponatremia [E87.1] Severe dehydration [E86.0] Intractable pain [R52] Closed fracture of right tibia and fibula, initial encounter [S82.91XA]  DISCHARGE DIAGNOSIS:  Closed fracture of tibia-fibula status post mechanical fall. Patient scheduled to have surgery next Wednesday Polysubstance abuse Hypotension improved Hyponatremia due to dehydration improved  SECONDARY DIAGNOSIS:   Past Medical History  Diagnosis Date  . Hypertension   . Hyperlipidemia   . ADHD (attention deficit hyperactivity disorder)   . Osteoporosis     HOSPITAL COURSE:  Sergey Ishler is a 45 y.o. 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  #1 syncope-Vasovagal and also likely orthostatic from hypotension. -No further testing needs to be done at this time. -Patient's blood pressure improved he is asymptomatic. I have asked him to hold his lisinopril for now.  #2 hypotension-Hypovolemic. Poor by mouth intake over the last few days. IV hydration -Monitor blood pressure. Hold antihypertensives -Improved symptomatically hold antihypertensives for now  #3 right leg tibial/fibular fracture-Orthopedics was contacted from the emergency room and they recommended outpatient follow-up and long splint. -Patient was seen by Dr. Hyacinth Meeker who has scheduled his surgery outpatient next Wednesday -Patient will be on aspirin buffered 325 by mouth daily. He'll stop it on Sunday for his  surgery on Wednesday -Pain management.  -Physical therapy consult for crutches  #4 hypertension- Hold his home medications  -Blood pressure is improved since admission  #5 osteoporosis-continue Fosamax. 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 Patient on Adderall  #7 DVT prophylaxis-since appears to be nonsurgical at this time, we'll start Lovenox.  #8 positive influenza test. Patient has been out of the window with his symptoms. He is improved no fever no signs of fever. No indication for treatment.  Will discharge patient to home spoke with mother was agreeable to CONSULTS OBTAINED:  Treatment Team:  Deeann Saint, MD  DRUG ALLERGIES:   Allergies  Allergen Reactions  . Penicillins Swelling, Rash and Other (See Comments)    Reaction:  Tongue swelling  Has patient had a PCN reaction causing immediate rash, facial/tongue/throat swelling, SOB or lightheadedness with hypotension: Yes Has patient had a PCN reaction causing severe rash involving mucus membranes or skin necrosis: No Has patient had a PCN reaction that required hospitalization No Has patient had a PCN reaction occurring within the last 10 years: No If all of the above answers are "NO", then may proceed with Cephalosporin use.    DISCHARGE MEDICATIONS:   Current Discharge Medication List    START taking these medications   Details  aspirin EC 325 MG tablet Take 1 tablet (325 mg total) by mouth daily. Start taking daily but stop after your dose on Sunday march 26 th for your surgery on next wednesday Qty: 30 tablet, Refills: 0    oxyCODONE (ROXICODONE) 5 MG immediate release tablet Take 1 tablet (5 mg total) by mouth every 4 (four) hours as needed for severe pain. Qty: 30 tablet, Refills: 0  CONTINUE these medications which have NOT CHANGED   Details  alendronate (FOSAMAX) 10 MG tablet Take 10 mg by mouth daily with lunch. Take with  a full glass of water on an empty stomach.    ALPRAZolam (XANAX) 0.5 MG tablet Take 0.5 mg by mouth 3 (three) times daily as needed for anxiety or sleep.    amphetamine-dextroamphetamine (ADDERALL XR) 20 MG 24 hr capsule Take 20 mg by mouth daily at 12 noon.    amphetamine-dextroamphetamine (ADDERALL) 10 MG tablet Take 10 mg by mouth daily with lunch.    budesonide-formoterol (SYMBICORT) 80-4.5 MCG/ACT inhaler Inhale 2 puffs into the lungs 2 (two) times daily.    EPINEPHrine (EPIPEN 2-PAK) 0.3 mg/0.3 mL IJ SOAJ injection Inject 0.3 mg into the muscle once as needed (for severe allergic reaction).    omega-3 acid ethyl esters (LOVAZA) 1 g capsule Take 1 g by mouth daily at 12 noon.    simvastatin (ZOCOR) 20 MG tablet Take 20 mg by mouth daily at 12 noon.    Tuberculin-Allergy Syringes (B-D ALLERGY SYRINGE 1CC/28G) 28G X 1/2" 1 ML MISC 1 Dose by Does not apply route once a week.      STOP taking these medications     Aspirin-Salicylamide-Caffeine (BC HEADACHE PO)      lisinopril (PRINIVIL,ZESTRIL) 20 MG tablet         If you experience worsening of your admission symptoms, develop shortness of breath, life threatening emergency, suicidal or homicidal thoughts you must seek medical attention immediately by calling 911 or calling your MD immediately  if symptoms less severe.  You Must read complete instructions/literature along with all the possible adverse reactions/side effects for all the Medicines you take and that have been prescribed to you. Take any new Medicines after you have completely understood and accept all the possible adverse reactions/side effects.   Please note  You were cared for by a hospitalist during your hospital stay. If you have any questions about your discharge medications or the care you received while you were in the hospital after you are discharged, you can call the unit and asked to speak with the hospitalist on call if the hospitalist that took care of  you is not available. Once you are discharged, your primary care physician will handle any further medical issues. Please note that NO REFILLS for any discharge medications will be authorized once you are discharged, as it is imperative that you return to your primary care physician (or establish a relationship with a primary care physician if you do not have one) for your aftercare needs so that they can reassess your need for medications and monitor your lab values. Today   SUBJECTIVE   Patient is ready to go home.  VITAL SIGNS:  Blood pressure 100/62, pulse 80, temperature 97.5 F (36.4 C), temperature source Oral, resp. rate 18, height 6\' 5"  (1.956 m), weight 89.858 kg (198 lb 1.6 oz), SpO2 95 %.  I/O:   Intake/Output Summary (Last 24 hours) at 05/17/15 1331 Last data filed at 05/17/15 0540  Gross per 24 hour  Intake   1150 ml  Output   1050 ml  Net    100 ml    PHYSICAL EXAMINATION:  GENERAL:  45 y.o.-year-old patient lying in the bed with no acute distress.  EYES: Pupils equal, round, reactive to light and accommodation. No scleral icterus. Extraocular muscles intact.  HEENT: Head atraumatic, normocephalic. Oropharynx and nasopharynx clear.  NECK:  Supple, no jugular venous distention. No  thyroid enlargement, no tenderness.  LUNGS: Normal breath sounds bilaterally, no wheezing, rales,rhonchi or crepitation. No use of accessory muscles of respiration.  CARDIOVASCULAR: S1, S2 normal. No murmurs, rubs, or gallops.  ABDOMEN: Soft, non-tender, non-distended. Bowel sounds present. No organomegaly or mass.  EXTREMITIES: No pedal edema, cyanosis, or clubbing. Right lower extremity splint present  NEUROLOGIC: Cranial nerves II through XII are intact. Muscle strength 5/5 in all extremities. Sensation intact. Gait not checked.  PSYCHIATRIC: The patient is alert and oriented x 3.  SKIN: No obvious rash, lesion, or ulcer.   DATA REVIEW:   CBC   Recent Labs Lab 05/17/15 0443  WBC 4.4   HGB 13.1  HCT 38.7*  PLT 160    Chemistries   Recent Labs Lab 05/16/15 1343 05/17/15 0443  NA 128* 133*  K 3.9 3.4*  CL 101 101  CO2 23 27  GLUCOSE 131* 106*  BUN 15 12  CREATININE 1.21 0.92  CALCIUM 8.1* 7.9*  AST 29  --   ALT 38  --   ALKPHOS 59  --   BILITOT 0.4  --     Microbiology Results   Recent Results (from the past 240 hour(s))  Rapid Influenza A&B Antigens ([facility] only)     Status: None   Collection Time: 05/16/15  1:43 PM  Result Value Ref Range Status   Influenza A ([facility]) NEGATIVE NEGATIVE Final   Influenza B [facility]) NEGATIVE NEGATIVE Final    RADIOLOGY:  Dg Tibia/fibula Right  05/16/2015  CLINICAL DATA:  Flu symptoms for 1 week. Passed out today. Leg pain. EXAM: RIGHT TIBIA AND FIBULA - 2 VIEW COMPARISON:  Right femur 12/27/2008 FINDINGS: Comminuted proximal right fibular diaphysis fracture with 7 mm of lateral displacement. Spiral fracture of the distal right tibial diaphysis with 9 mm of lateral displacement. No other fracture or dislocation. Serpiginous sclerotic lesion in the distal femoral metaphysis without endosteal scalloping, bone destruction or soft tissue mass most consistent with a bone infarct. IMPRESSION: Comminuted proximal right fibular diaphysis fracture with 7 mm of lateral displacement. Spiral fracture of the distal right tibial diaphysis with 9 mm of lateral displacement. Electronically Signed   By: Elige Ko   On: 05/16/2015 13:28     Management plans discussed with the patient, family and they are in agreement.  CODE STATUS:     Code Status Orders        Start     Ordered   05/16/15 1752  Full code   Continuous     05/16/15 1751    Code Status History    Date Active Date Inactive Code Status Order ID Comments User Context   This patient has a current code status but no historical code status.      TOTAL TIME TAKING CARE OF THIS PATIENT: 40 minutes.    Mekaila Tarnow M.D on 05/17/2015 at 1:31 PM  Between 7am to 6pm -  Pager - [phone] After 6pm go to [email or web address] - password [email or web address] [facility]  [facility]  Office  [phone]  CC: Primary care physician; No primary care provider on file.

## 2015-05-17 NOTE — Progress Notes (Signed)
PT Cancellation Note  Patient Details Name: Theodore PeekCharles Ging MRN: [id number] DOB: 02/23/1971   Cancelled Treatment:    Reason Eval/Treat Not Completed: Medical issues which prohibited therapy. Records and imaging reviewed. Orthopedic consult pending. Will await inpatient orthopedic consult to determine plan of care prior to PT evaluation.   Sharalyn InkJason D Manda Holstad PT, DPT   Jasminne Mealy 05/17/2015, 8:38 AM

## 2015-05-17 NOTE — Discharge Instructions (Signed)
Use crtuches

## 2015-05-17 NOTE — Evaluation (Signed)
Physical Therapy Evaluation Patient Details Name: Theodore Garcia MRN: [id number] DOB: 05/27/1970 Today's Date: 05/17/2015   History of Present Illness  Theodore Garcia  is a 45 y.o. 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 Patient said he started feeling sick about 3-4 days ago. Started with chills, body aches and nausea. He had some sore throat and nasal congestion. He has been not eating and drinking well since then. Since been feeling weak unable to get out of bed for the last couple of days. Today was the first day that he felt little stronger and wanted to get up and take his medications and, as soon as he stood up moving he felt dizzy and passed out. When he woke up he noticed that his right leg was extremely painful and swollen. He still dragged himself to the bathroom because he was having diarrhea and had a second syncopal episode over there. His mom who came to check on him brought into the hospital. Was noted to be hypotensive in the emergency room, responding to IV fluids. Also x-ray showing right tibial and fibular fracture. Pt still awaiting orthopedic consult. He tested positive for influenza A. No other falls in the last 12 months.   Clinical Impression  Pt demonstrates appropriate use of assistive device for transfers and ambulation. He is modified independent for all mobility and while he is slightly impulsive he does not experience any overt LOB for which he is unable to correct. Pt able to complete stair training and demonstrates ability to enter/exit home. He is most stable with standard walker which is also his device of choice so recommendation entered. Discussed need for tall standard walker with care manager and DME rep. Pt will eventually need OP PT per MD discretion s/p surgery. Pt will benefit from  skilled PT services to address deficits in strength, balance, and mobility in order to return to full function at home.     Follow Up Recommendations Outpatient PT;Other (comment) (OP PT per MD order once appropriate after surgery)    Equipment Recommendations  Standard walker;Other (comment) (Pt is 6'5" and will need tall standard walker)    Recommendations for Other Services       Precautions / Restrictions Precautions Precautions: Fall Restrictions Weight Bearing Restrictions: Yes RLE Weight Bearing: Non weight bearing      Mobility  Bed Mobility Overal bed mobility: Independent             General bed mobility comments: Good speed and squencing with HOB flat and no bed rails  Transfers Overall transfer level: Modified independent Equipment used: Rolling walker (2 wheeled)             General transfer comment: Pt demonstrates good speed, sequencing, and stability with transfers. Slightly impulsive but no overt LOB  Ambulation/Gait Ambulation/Gait assistance: Modified independent (Device/Increase time) Ambulation Distance (Feet): 80 Feet Assistive device: Rolling walker (2 wheeled);Crutches;Standard walker   Gait velocity: Adequate for full household ambulation   General Gait Details: Pt ambulates with hop-to pattern with rolling walker initially. Demonstrates good sequencing and stride length while maintaining NWB on RLE. Pt is slightly impulsive and over confident with use of assistive device. Progressed to axillary crutches which patient is able to utilize safely but not feel as secure. Finally attempted with standard walker per patient's request. Pt reports improved stability with  standard walker feeling considerably safer than with rolling walker.   Stairs Stairs: Yes Stairs assistance: Supervision Stair Management: Two rails;Step to pattern Number of Stairs: 4 General stair comments: Pt able to hop up stairs with bilateral UE support. Pt is somewhat  impulsive but safe during attempt. Pt acutally takes stairs 2 at a time despite input from therapist  Wheelchair Mobility    Modified Rankin (Stroke Patients Only)       Balance Overall balance assessment: Needs assistance Sitting-balance support: No upper extremity supported Sitting balance-Leahy Scale: Normal     Standing balance support: No upper extremity supported Standing balance-Leahy Scale: Fair Standing balance comment: Pt able to perform single leg balance for function time on LLE without UE support                             Pertinent Vitals/Pain Pain Assessment: 0-10 Pain Score: 10-Worst pain ever Pain Location: RLE Pain Intervention(s): Limited activity within patient's tolerance;Monitored during session    Home Living Family/patient expects to be discharged to:: Private residence Living Arrangements: Alone Available Help at Discharge: Family Type of Home: House Home Access: Stairs to enter Entrance Stairs-Rails: Can reach both Entrance Stairs-Number of Steps: 5 Home Layout: One level Home Equipment: Other (comment) (Pt unsure if he has crutches at home.)      Prior Function Level of Independence: Independent         Comments: Independent with ADLs/IADLs     Hand Dominance   Dominant Hand: Left    Extremity/Trunk Assessment   Upper Extremity Assessment: Overall WFL for tasks assessed           Lower Extremity Assessment: RLE deficits/detail RLE Deficits / Details: Unable to assess R ankle due to immobilization. Able to perform full SLR as well as LAQ with RLE. Pt able to flex and extend toes on R foot. Intact sensation to light touch with good capillary refill       Communication   Communication: No difficulties  Cognition Arousal/Alertness: Awake/alert Behavior During Therapy: WFL for tasks assessed/performed Overall Cognitive Status: Within Functional Limits for tasks assessed                      General  Comments      Exercises        Assessment/Plan    PT Assessment Patient needs continued PT services  PT Diagnosis Difficulty walking;Abnormality of gait;Acute pain   PT Problem List Decreased strength;Decreased activity tolerance;Decreased mobility;Decreased safety awareness;Pain  PT Treatment Interventions DME instruction;Gait training;Stair training;Therapeutic activities;Therapeutic exercise;Balance training;Patient/family education   PT Goals (Current goals can be found in the Care Plan section) Acute Rehab PT Goals Patient Stated Goal: . PT Goal Formulation: With patient/family Time For Goal Achievement: 05/31/15 Potential to Achieve Goals: Good    Frequency 7X/week   Barriers to discharge Decreased caregiver support Lives alone    Co-evaluation               End of Session Equipment Utilized During Treatment: Gait belt Activity Tolerance: Other (comment);Treatment limited secondary to medical complications (Comment) (Mildly limited due to nausea and acid reflux) Patient left: in bed;with nursing/sitter in room;with family/visitor present Nurse Communication: Mobility status         Time: 1345-1410 PT Time Calculation (min) (ACUTE ONLY): 25 min   Charges:   PT Evaluation $PT Eval Low Complexity: 1 Procedure PT Treatments $Gait Training: 8-22 mins  PT G Codes:       Lynnea Maizes PT, DPT   Venesha Petraitis 05/17/2015, 2:31 PM

## 2015-05-17 NOTE — Care Management Note (Signed)
Case Management Note  Patient Details  Name: Theodore Garcia MRN: [id number] Date of Birth: May 07, 1970  Subjective/Objective:                  Met with patient to discuss discharge planning. Patient states "he passed out and that's how I broke my leg". He states he's been sick for a while and getting over it. He states his mother will be able to provide assistance at discharge. He has history of outpatient PT in past and would like to do that again at discharge. He may have crutches if his mother can get someone to reach them from storage. PT evaluation pending.  Action/Plan: RNCM left a list of home health agencies at bedside. RNCM will continue to follow.   Expected Discharge Date:                  Expected Discharge Plan:     In-House Referral:     Discharge planning Services  CM Consult  Post Acute Care Choice:  Durable Medical Equipment, Home Health Choice offered to:  Patient  DME Arranged:    DME Agency:     HH Arranged:    [place] Agency:     Status of Service:  In process, will continue to follow  Medicare Important Message Given:    Date Medicare IM Given:    Medicare IM give by:    Date Additional Medicare IM Given:    Additional Medicare Important Message give by:     If discussed at [place] of Stay Meetings, dates discussed:    Additional Comments:  Marshell Garfinkel, RN 05/17/2015, 9:03 AM

## 2015-05-22 NOTE — Patient Instructions (Signed)
  Your procedure is scheduled on3/29/17 Report to Day Surgery. [facility]OND FLOOR To find out your arrival time please call [phone] between 1PM - 3PM oN 05/22/15  Remember: Instructions that are not followed completely may result in serious medical risk, up to and including death, or upon the discretion of your surgeon and anesthesiologist your surgery may need to be rescheduled.    __X__ 1. Do not eat food or drink liquids after midnight. No gum chewing or hard candies.     __X__ 2. No Alcohol for 24 hours before or after surgery.   ____ 3. Bring all medications with you on the day of surgery if instructed.    _X___ 4. Notify your doctor if there is any change in your medical condition     (cold, fever, infections).     Do not wear jewelry, make-up, hairpins, clips or nail polish.  Do not wear lotions, powders, or perfumes. You may wear deodorant.  Do not shave 48 hours prior to surgery. Men may shave face and neck.  Do not bring valuables to the hospital.    [facility] is not responsible for any belongings or valuables.               Contacts, dentures or bridgework may not be worn into surgery.  Leave your suitcase in the car. After surgery it may be brought to your room.  For patients admitted to the hospital, discharge time is determined by your                treatment team.   Patients discharged the day of surgery will not be allowed to drive home.   Please read over the following fact sheets that you were given:   Surgical Site Infection Prevention   ____ Take these medicines the morning of surgery with A SIP OF WATER:    1. XANAX  2.   3.   4.  5.  6.  ____ Fleet Enema (as directed)   ____ Use CHG Soap as directed  ____ Use inhalers on the day of surgery  ____ Stop metformin 2 days prior to surgery    ____ Take 1/2 of usual insulin dose the night before surgery and none on the morning of surgery.   ____ Stop Coumadin/Plavix/aspirin on  ____  Stop Anti-inflammatories on   NO MORE LOVAZA OR ASPIRIN UNTIL AFTER SURGERY  ____ Stop supplements until after surgery.    ____ Bring C-Pap to the hospital.

## 2015-05-22 NOTE — Pre-Procedure Instructions (Signed)
UNABLE TO REACH PATIENT AT LISTED NUMBER. LEFT INSTRUCTIONS

## 2015-05-23 ENCOUNTER — Inpatient Hospital Stay: Payer: BLUE CROSS/BLUE SHIELD

## 2015-05-23 ENCOUNTER — Inpatient Hospital Stay
Admission: EM | Admit: 2015-05-23 | Discharge: 2015-05-25 | DRG: 493 | Disposition: A | Discharge status: Home / Self Care | Payer: BLUE CROSS/BLUE SHIELD | Source: Ambulatory Visit | Attending: Specialist | Admitting: Specialist

## 2015-05-23 ENCOUNTER — Inpatient Hospital Stay: Payer: BLUE CROSS/BLUE SHIELD | Admitting: Anesthesiology

## 2015-05-23 ENCOUNTER — Encounter: Payer: Self-pay | Admitting: *Deleted

## 2015-05-23 ENCOUNTER — Encounter
Admission: EM | Disposition: A | Discharge status: Home / Self Care | Payer: Self-pay | Source: Ambulatory Visit | Attending: Specialist

## 2015-05-23 DIAGNOSIS — F17210 Nicotine dependence, cigarettes, uncomplicated: Secondary | ICD-10-CM | POA: Diagnosis present

## 2015-05-23 DIAGNOSIS — Z8249 Family history of ischemic heart disease and other diseases of the circulatory system: Secondary | ICD-10-CM

## 2015-05-23 DIAGNOSIS — S82301A Unspecified fracture of lower end of right tibia, initial encounter for closed fracture: Secondary | ICD-10-CM | POA: Diagnosis present

## 2015-05-23 DIAGNOSIS — T782XXA Anaphylactic shock, unspecified, initial encounter: Secondary | ICD-10-CM | POA: Diagnosis not present

## 2015-05-23 DIAGNOSIS — W19XXXA Unspecified fall, initial encounter: Secondary | ICD-10-CM | POA: Diagnosis present

## 2015-05-23 DIAGNOSIS — Z9889 Other specified postprocedural states: Secondary | ICD-10-CM | POA: Diagnosis not present

## 2015-05-23 DIAGNOSIS — Z96642 Presence of left artificial hip joint: Secondary | ICD-10-CM | POA: Diagnosis present

## 2015-05-23 DIAGNOSIS — R0600 Dyspnea, unspecified: Secondary | ICD-10-CM | POA: Diagnosis not present

## 2015-05-23 DIAGNOSIS — J9690 Respiratory failure, unspecified, unspecified whether with hypoxia or hypercapnia: Secondary | ICD-10-CM

## 2015-05-23 DIAGNOSIS — J342 Deviated nasal septum: Secondary | ICD-10-CM | POA: Diagnosis present

## 2015-05-23 DIAGNOSIS — R220 Localized swelling, mass and lump, head: Secondary | ICD-10-CM | POA: Diagnosis not present

## 2015-05-23 DIAGNOSIS — I10 Essential (primary) hypertension: Secondary | ICD-10-CM | POA: Diagnosis present

## 2015-05-23 DIAGNOSIS — T886XXA Anaphylactic reaction due to adverse effect of correct drug or medicament properly administered, initial encounter: Secondary | ICD-10-CM | POA: Diagnosis present

## 2015-05-23 DIAGNOSIS — Z79899 Other long term (current) drug therapy: Secondary | ICD-10-CM | POA: Diagnosis not present

## 2015-05-23 DIAGNOSIS — M810 Age-related osteoporosis without current pathological fracture: Secondary | ICD-10-CM | POA: Diagnosis present

## 2015-05-23 DIAGNOSIS — K130 Diseases of lips: Secondary | ICD-10-CM | POA: Diagnosis not present

## 2015-05-23 DIAGNOSIS — S82201A Unspecified fracture of shaft of right tibia, initial encounter for closed fracture: Secondary | ICD-10-CM | POA: Diagnosis present

## 2015-05-23 DIAGNOSIS — Z419 Encounter for procedure for purposes other than remedying health state, unspecified: Secondary | ICD-10-CM

## 2015-05-23 DIAGNOSIS — Z880 Allergy status to penicillin: Secondary | ICD-10-CM

## 2015-05-23 DIAGNOSIS — Z8042 Family history of malignant neoplasm of prostate: Secondary | ICD-10-CM | POA: Diagnosis not present

## 2015-05-23 DIAGNOSIS — S82291A Other fracture of shaft of right tibia, initial encounter for closed fracture: Secondary | ICD-10-CM

## 2015-05-23 DIAGNOSIS — T783XXA Angioneurotic edema, initial encounter: Secondary | ICD-10-CM | POA: Diagnosis not present

## 2015-05-23 DIAGNOSIS — E871 Hypo-osmolality and hyponatremia: Secondary | ICD-10-CM | POA: Diagnosis present

## 2015-05-23 HISTORY — PX: TIBIA IM NAIL INSERTION: SHX2516

## 2015-05-23 LAB — CBC
HCT: 35.2 % — ABNORMAL LOW (ref 40.0–52.0)
HEMOGLOBIN: 12.1 g/dL — AB (ref 13.0–18.0)
MCH: 32 pg (ref 26.0–34.0)
MCHC: 34.3 g/dL (ref 32.0–36.0)
MCV: 93.4 fL (ref 80.0–100.0)
Platelets: 315 10*3/uL (ref 150–440)
RBC: 3.77 MIL/uL — AB (ref 4.40–5.90)
RDW: 13.3 % (ref 11.5–14.5)
WBC: 10.7 10*3/uL — ABNORMAL HIGH (ref 3.8–10.6)

## 2015-05-23 LAB — URINE DRUG SCREEN, QUALITATIVE (ARMC ONLY)
AMPHETAMINES, UR SCREEN: POSITIVE — AB
BENZODIAZEPINE, UR SCRN: POSITIVE — AB
Barbiturates, Ur Screen: NOT DETECTED
Cannabinoid 50 Ng, Ur ~~LOC~~: POSITIVE — AB
Cocaine Metabolite,Ur ~~LOC~~: NOT DETECTED
MDMA (ECSTASY) UR SCREEN: NOT DETECTED
Methadone Scn, Ur: NOT DETECTED
Opiate, Ur Screen: NOT DETECTED
Phencyclidine (PCP) Ur S: NOT DETECTED
TRICYCLIC, UR SCREEN: NOT DETECTED

## 2015-05-23 LAB — CREATININE, SERUM
CREATININE: 0.86 mg/dL (ref 0.61–1.24)
GFR calc Af Amer: >60 mL/min (ref >60)

## 2015-05-23 SURGERY — INSERTION, INTRAMEDULLARY ROD, TIBIA
Anesthesia: Spinal | Laterality: Right

## 2015-05-23 MED ORDER — SIMVASTATIN 20 MG PO TABS: 20.0000 mg | ORAL_TABLET | Freq: Every day | ORAL | Status: DC

## 2015-05-23 MED ORDER — METHOCARBAMOL 500 MG PO TABS
500.0000 mg | ORAL_TABLET | Freq: Four times a day (QID) | ORAL | Status: DC | PRN
  Administered 2015-05-24: 500 mg via ORAL
  Filled 2015-05-23 (×2): qty 1

## 2015-05-23 MED ORDER — CELECOXIB 200 MG PO CAPS
200.0000 mg | ORAL_CAPSULE | Freq: Two times a day (BID) | ORAL | Status: DC
  Administered 2015-05-23: 200 mg via ORAL
  Filled 2015-05-23 (×2): qty 1

## 2015-05-23 MED ORDER — PROPOFOL 500 MG/50ML IV EMUL
INTRAVENOUS | Status: DC | PRN
  Administered 2015-05-23: 75 ug/kg/min via INTRAVENOUS

## 2015-05-23 MED ORDER — FAMOTIDINE IN NACL 20-0.9 MG/50ML-% IV SOLN
20.0000 mg | Freq: Two times a day (BID) | INTRAVENOUS | Status: DC
  Filled 2015-05-23 (×2): qty 50

## 2015-05-23 MED ORDER — FAMOTIDINE 20 MG PO TABS
ORAL_TABLET | ORAL | Status: AC
  Administered 2015-05-23: 20 mg via ORAL
  Filled 2015-05-23: qty 1

## 2015-05-23 MED ORDER — ONDANSETRON HCL 4 MG/2ML IJ SOLN: 4.0000 mg | Freq: Once | INTRAMUSCULAR | Status: DC | PRN

## 2015-05-23 MED ORDER — CEFAZOLIN SODIUM-DEXTROSE 2-4 GM/100ML-% IV SOLN
2.0000 g | INTRAVENOUS | Status: AC
  Administered 2015-05-23: 2 g via INTRAVENOUS

## 2015-05-23 MED ORDER — OMEGA-3-ACID ETHYL ESTERS 1 G PO CAPS: 1.0000 g | ORAL_CAPSULE | Freq: Every day | ORAL | Status: DC

## 2015-05-23 MED ORDER — METHYLPREDNISOLONE SODIUM SUCC 125 MG IJ SOLR: 80.0000 mg | Freq: Two times a day (BID) | INTRAMUSCULAR | Status: DC

## 2015-05-23 MED ORDER — METHYLPREDNISOLONE SODIUM SUCC 125 MG IJ SOLR
80.0000 mg | Freq: Two times a day (BID) | INTRAMUSCULAR | Status: DC
  Administered 2015-05-24 – 2015-05-25 (×2): 80 mg via INTRAVENOUS
  Filled 2015-05-23 (×2): qty 2

## 2015-05-23 MED ORDER — MOMETASONE FURO-FORMOTEROL FUM 100-5 MCG/ACT IN AERO
2.0000 | INHALATION_SPRAY | Freq: Two times a day (BID) | RESPIRATORY_TRACT | Status: DC
Start: 2015-05-23 — End: 2015-05-23
  Filled 2015-05-23: qty 8.8

## 2015-05-23 MED ORDER — CLINDAMYCIN PHOSPHATE 600 MG/50ML IV SOLN
INTRAVENOUS | Status: AC
Start: 2015-05-23 — End: 2015-05-23
  Administered 2015-05-23: 600 mg via INTRAVENOUS
  Filled 2015-05-23: qty 50

## 2015-05-23 MED ORDER — NICOTINE 14 MG/24HR TD PT24
14.0000 mg | MEDICATED_PATCH | Freq: Every day | TRANSDERMAL | Status: DC
  Administered 2015-05-23 – 2015-05-25 (×3): 14 mg via TRANSDERMAL
  Filled 2015-05-23 (×3): qty 1

## 2015-05-23 MED ORDER — BISACODYL 10 MG RE SUPP: 10.0000 mg | Freq: Every day | RECTAL | Status: DC | PRN

## 2015-05-23 MED ORDER — METHOCARBAMOL 1000 MG/10ML IJ SOLN
500.0000 mg | Freq: Four times a day (QID) | INTRAMUSCULAR | Status: DC | PRN
  Filled 2015-05-23: qty 5

## 2015-05-23 MED ORDER — FENTANYL CITRATE (PF) 100 MCG/2ML IJ SOLN
25.0000 ug | INTRAMUSCULAR | Status: DC | PRN
  Administered 2015-05-23 – 2015-05-24 (×4): 50 ug via INTRAVENOUS
  Filled 2015-05-23 (×4): qty 2

## 2015-05-23 MED ORDER — CLINDAMYCIN PHOSPHATE 600 MG/50ML IV SOLN
600.0000 mg | Freq: Three times a day (TID) | INTRAVENOUS | Status: DC
  Administered 2015-05-23: 600 mg via INTRAVENOUS
  Filled 2015-05-23 (×3): qty 50

## 2015-05-23 MED ORDER — ENOXAPARIN SODIUM 40 MG/0.4ML ~~LOC~~ SOLN
40.0000 mg | SUBCUTANEOUS | Status: DC
  Administered 2015-05-24 – 2015-05-25 (×2): 40 mg via SUBCUTANEOUS
  Filled 2015-05-23 (×2): qty 0.4

## 2015-05-23 MED ORDER — CEFAZOLIN SODIUM-DEXTROSE 2-3 GM-% IV SOLR
INTRAVENOUS | Status: DC | PRN
  Administered 2015-05-23: 1 g via INTRAVENOUS
  Administered 2015-05-23: 2 g via INTRAVENOUS

## 2015-05-23 MED ORDER — RANITIDINE HCL 50 MG/2ML IJ SOLN
50.0000 mg | Freq: Once | INTRAMUSCULAR | Status: DC
  Administered 2015-05-23: 50 mg via INTRAVENOUS
  Filled 2015-05-23: qty 2

## 2015-05-23 MED ORDER — METHYLPREDNISOLONE SODIUM SUCC 40 MG IJ SOLR
INTRAMUSCULAR | Status: AC
  Filled 2015-05-23: qty 1

## 2015-05-23 MED ORDER — BUDESONIDE 0.25 MG/2ML IN SUSP
0.2500 mg | Freq: Four times a day (QID) | RESPIRATORY_TRACT | Status: DC
  Administered 2015-05-23: 0.25 mg via RESPIRATORY_TRACT
  Filled 2015-05-23: qty 2

## 2015-05-23 MED ORDER — LACTATED RINGERS IV SOLN
INTRAVENOUS | Status: DC
  Administered 2015-05-23: 10:00:00 via INTRAVENOUS

## 2015-05-23 MED ORDER — SODIUM CHLORIDE 0.45 % IV SOLN
INTRAVENOUS | Status: DC
  Administered 2015-05-23: 18:00:00 via INTRAVENOUS

## 2015-05-23 MED ORDER — LACTATED RINGERS IV SOLN
INTRAVENOUS | Status: DC | PRN
  Administered 2015-05-23: 11:00:00 via INTRAVENOUS

## 2015-05-23 MED ORDER — MELOXICAM 7.5 MG PO TABS
7.5000 mg | ORAL_TABLET | Freq: Once | ORAL | Status: AC
  Administered 2015-05-23: 7.5 mg via ORAL

## 2015-05-23 MED ORDER — HYDROCODONE-ACETAMINOPHEN 7.5-325 MG PO TABS: 1.0000 | ORAL_TABLET | ORAL | Status: DC | PRN

## 2015-05-23 MED ORDER — DIPHENHYDRAMINE HCL 50 MG/ML IJ SOLN
12.5000 mg | Freq: Four times a day (QID) | INTRAMUSCULAR | Status: AC
  Administered 2015-05-23 – 2015-05-24 (×3): 12.5 mg via INTRAVENOUS
  Filled 2015-05-23 (×3): qty 1

## 2015-05-23 MED ORDER — MAGNESIUM HYDROXIDE 400 MG/5ML PO SUSP: 30.0000 mL | Freq: Every day | ORAL | Status: DC | PRN

## 2015-05-23 MED ORDER — NEOMYCIN-POLYMYXIN B GU 40-200000 IR SOLN
Status: DC | PRN
  Administered 2015-05-23: 4 mL

## 2015-05-23 MED ORDER — FAMOTIDINE IN NACL 20-0.9 MG/50ML-% IV SOLN
20.0000 mg | Freq: Two times a day (BID) | INTRAVENOUS | Status: DC
  Administered 2015-05-23 – 2015-05-25 (×3): 20 mg via INTRAVENOUS
  Filled 2015-05-23 (×5): qty 50

## 2015-05-23 MED ORDER — FENTANYL CITRATE (PF) 100 MCG/2ML IJ SOLN
INTRAMUSCULAR | Status: DC | PRN
  Administered 2015-05-23 (×2): 50 ug via INTRAVENOUS

## 2015-05-23 MED ORDER — EPHEDRINE SULFATE 50 MG/ML IJ SOLN
INTRAMUSCULAR | Status: DC | PRN
  Administered 2015-05-23 (×2): 10 mg via INTRAVENOUS

## 2015-05-23 MED ORDER — ACETAMINOPHEN 325 MG PO TABS: 650.0000 mg | ORAL_TABLET | Freq: Four times a day (QID) | ORAL | Status: DC | PRN

## 2015-05-23 MED ORDER — GABAPENTIN 400 MG PO CAPS
400.0000 mg | ORAL_CAPSULE | Freq: Once | ORAL | Status: AC
  Administered 2015-05-23: 400 mg via ORAL

## 2015-05-23 MED ORDER — CLINDAMYCIN PHOSPHATE 600 MG/50ML IV SOLN
600.0000 mg | Freq: Three times a day (TID) | INTRAVENOUS | Status: DC
  Administered 2015-05-23: 600 mg via INTRAVENOUS

## 2015-05-23 MED ORDER — "TUBERCULIN-ALLERGY SYRINGES 28G X 1/2"" 1 ML MISC": 1.0000 | Status: DC

## 2015-05-23 MED ORDER — GABAPENTIN 400 MG PO CAPS
ORAL_CAPSULE | ORAL | Status: AC
  Administered 2015-05-23: 400 mg via ORAL
  Filled 2015-05-23: qty 1

## 2015-05-23 MED ORDER — ONDANSETRON HCL 4 MG PO TABS: 4.0000 mg | ORAL_TABLET | Freq: Four times a day (QID) | ORAL | Status: DC | PRN

## 2015-05-23 MED ORDER — ONDANSETRON HCL 4 MG/2ML IJ SOLN
4.0000 mg | Freq: Four times a day (QID) | INTRAMUSCULAR | Status: DC | PRN
  Filled 2015-05-23: qty 2

## 2015-05-23 MED ORDER — CEFAZOLIN SODIUM-DEXTROSE 2-4 GM/100ML-% IV SOLN
INTRAVENOUS | Status: AC
Start: 2015-05-23 — End: 2015-05-23
  Administered 2015-05-23: 2 g via INTRAVENOUS
  Filled 2015-05-23: qty 100

## 2015-05-23 MED ORDER — GABAPENTIN 400 MG PO CAPS
400.0000 mg | ORAL_CAPSULE | Freq: Once | ORAL | Status: AC
  Administered 2015-05-23: 400 mg via ORAL
  Filled 2015-05-23: qty 1

## 2015-05-23 MED ORDER — METHYLPREDNISOLONE SODIUM SUCC 125 MG IJ SOLR
125.0000 mg | Freq: Once | INTRAMUSCULAR | Status: AC
Start: 2015-05-23 — End: 2015-05-23
  Administered 2015-05-23: 100 mg via INTRAVENOUS

## 2015-05-23 MED ORDER — ONDANSETRON HCL 4 MG/2ML IJ SOLN
4.0000 mg | Freq: Once | INTRAMUSCULAR | Status: AC
Start: 2015-05-23 — End: 2015-05-23
  Administered 2015-05-23: 4 mg via INTRAVENOUS

## 2015-05-23 MED ORDER — METHYLPREDNISOLONE SODIUM SUCC 40 MG IJ SOLR
25.0000 mg | Freq: Once | INTRAMUSCULAR | Status: AC
Start: 2015-05-23 — End: 2015-05-23
  Administered 2015-05-23: 25 mg via INTRAVENOUS

## 2015-05-23 MED ORDER — MELOXICAM 7.5 MG PO TABS
ORAL_TABLET | ORAL | Status: AC
  Administered 2015-05-23: 7.5 mg via ORAL
  Filled 2015-05-23: qty 1

## 2015-05-23 MED ORDER — BUDESONIDE 0.25 MG/2ML IN SUSP
0.2500 mg | Freq: Four times a day (QID) | RESPIRATORY_TRACT | Status: DC
  Administered 2015-05-24 (×3): 0.25 mg via RESPIRATORY_TRACT
  Filled 2015-05-23 (×4): qty 2

## 2015-05-23 MED ORDER — LEVALBUTEROL HCL 0.63 MG/3ML IN NEBU
0.6300 mg | INHALATION_SOLUTION | Freq: Four times a day (QID) | RESPIRATORY_TRACT | Status: DC
  Administered 2015-05-23: 0.63 mg via RESPIRATORY_TRACT

## 2015-05-23 MED ORDER — EPINEPHRINE 0.3 MG/0.3ML IJ SOAJ
0.3000 mg | Freq: Once | INTRAMUSCULAR | Status: DC | PRN
Start: 2015-05-23 — End: 2015-05-25
  Filled 2015-05-23 (×5): qty 0.3

## 2015-05-23 MED ORDER — MORPHINE SULFATE (PF) 2 MG/ML IV SOLN
2.0000 mg | INTRAVENOUS | Status: DC | PRN
  Administered 2015-05-23: 2 mg via INTRAVENOUS
  Filled 2015-05-23: qty 1

## 2015-05-23 MED ORDER — LEVALBUTEROL HCL 0.63 MG/3ML IN NEBU
0.6300 mg | INHALATION_SOLUTION | Freq: Four times a day (QID) | RESPIRATORY_TRACT | Status: DC
  Administered 2015-05-24 (×3): 0.63 mg via RESPIRATORY_TRACT
  Filled 2015-05-23 (×4): qty 3

## 2015-05-23 MED ORDER — ALENDRONATE SODIUM 10 MG PO TABS: 10.0000 mg | ORAL_TABLET | Freq: Every day | ORAL | Status: DC

## 2015-05-23 MED ORDER — METOCLOPRAMIDE HCL 5 MG/ML IJ SOLN: 5.0000 mg | Freq: Three times a day (TID) | INTRAMUSCULAR | Status: DC | PRN

## 2015-05-23 MED ORDER — AMPHETAMINE-DEXTROAMPHETAMINE 5 MG PO TABS: 10.0000 mg | ORAL_TABLET | Freq: Every day | ORAL | Status: DC

## 2015-05-23 MED ORDER — LEVALBUTEROL HCL 0.63 MG/3ML IN NEBU
0.6300 mg | INHALATION_SOLUTION | RESPIRATORY_TRACT | Status: DC | PRN
  Filled 2015-05-23: qty 3

## 2015-05-23 MED ORDER — NEOMYCIN-POLYMYXIN B GU 40-200000 IR SOLN
Status: AC
  Filled 2015-05-23: qty 4

## 2015-05-23 MED ORDER — FAMOTIDINE 20 MG PO TABS
20.0000 mg | ORAL_TABLET | Freq: Once | ORAL | Status: AC
  Administered 2015-05-23: 20 mg via ORAL

## 2015-05-23 MED ORDER — HYDROCODONE-ACETAMINOPHEN 7.5-325 MG PO TABS
1.0000 | ORAL_TABLET | ORAL | Status: DC | PRN
  Administered 2015-05-23: 2 via ORAL
  Filled 2015-05-23: qty 2

## 2015-05-23 MED ORDER — ALPRAZOLAM 0.5 MG PO TABS
0.5000 mg | ORAL_TABLET | Freq: Three times a day (TID) | ORAL | Status: DC | PRN
  Administered 2015-05-23: 0.5 mg via ORAL
  Filled 2015-05-23: qty 1

## 2015-05-23 MED ORDER — DIPHENHYDRAMINE HCL 50 MG/ML IJ SOLN
INTRAMUSCULAR | Status: AC
  Administered 2015-05-23: 19:00:00
  Filled 2015-05-23: qty 1

## 2015-05-23 MED ORDER — BUPIVACAINE-EPINEPHRINE (PF) 0.25% -1:200000 IJ SOLN
INTRAMUSCULAR | Status: AC
  Filled 2015-05-23: qty 30

## 2015-05-23 MED ORDER — METOCLOPRAMIDE HCL 10 MG PO TABS: 5.0000 mg | ORAL_TABLET | Freq: Three times a day (TID) | ORAL | Status: DC | PRN

## 2015-05-23 MED ORDER — FENTANYL CITRATE (PF) 100 MCG/2ML IJ SOLN: 25.0000 ug | INTRAMUSCULAR | Status: DC | PRN

## 2015-05-23 MED ORDER — BUPIVACAINE HCL (PF) 0.5 % IJ SOLN
INTRAMUSCULAR | Status: DC | PRN
  Administered 2015-05-23: 3 mL

## 2015-05-23 MED ORDER — EPINEPHRINE HCL 0.1 MG/ML IJ SOSY
0.3000 mg | PREFILLED_SYRINGE | Freq: Once | INTRAMUSCULAR | Status: AC
  Administered 2015-05-23: 0.3 mg via INTRAVENOUS
  Filled 2015-05-23 (×2): qty 10

## 2015-05-23 MED ORDER — BUPIVACAINE-EPINEPHRINE (PF) 0.25% -1:200000 IJ SOLN
INTRAMUSCULAR | Status: DC | PRN
  Administered 2015-05-23: 4 mL
  Administered 2015-05-23: 7 mL via PERINEURAL
  Administered 2015-05-23: 5 mL

## 2015-05-23 MED ORDER — IPRATROPIUM BROMIDE 0.02 % IN SOLN
0.5000 mg | Freq: Four times a day (QID) | RESPIRATORY_TRACT | Status: DC
  Administered 2015-05-23 – 2015-05-24 (×4): 0.5 mg via RESPIRATORY_TRACT
  Filled 2015-05-23 (×5): qty 2.5

## 2015-05-23 MED ORDER — PHENYLEPHRINE HCL 10 MG/ML IJ SOLN
INTRAMUSCULAR | Status: DC | PRN
  Administered 2015-05-23: 100 ug via INTRAVENOUS

## 2015-05-23 MED ORDER — RANITIDINE HCL 50 MG/2ML IJ SOLN
50.0000 mg | Freq: Once | INTRAVENOUS | Status: DC
  Filled 2015-05-23: qty 2

## 2015-05-23 MED ORDER — SENNA 8.6 MG PO TABS
1.0000 | ORAL_TABLET | Freq: Two times a day (BID) | ORAL | Status: DC
  Administered 2015-05-23: 8.6 mg via ORAL
  Filled 2015-05-23 (×3): qty 1

## 2015-05-23 MED ORDER — AMPHETAMINE-DEXTROAMPHET ER 5 MG PO CP24
20.0000 mg | ORAL_CAPSULE | Freq: Every day | ORAL | Status: DC
  Administered 2015-05-24 – 2015-05-25 (×2): 20 mg via ORAL
  Filled 2015-05-23 (×3): qty 4

## 2015-05-23 MED ORDER — MIDAZOLAM HCL 2 MG/2ML IJ SOLN
INTRAMUSCULAR | Status: DC | PRN
  Administered 2015-05-23: 2 mg via INTRAVENOUS

## 2015-05-23 MED ORDER — FLEET ENEMA 7-19 GM/118ML RE ENEM: 1.0000 | ENEMA | Freq: Once | RECTAL | Status: DC | PRN

## 2015-05-23 MED ORDER — CEFAZOLIN SODIUM-DEXTROSE 2-4 GM/100ML-% IV SOLN
2.0000 g | Freq: Four times a day (QID) | INTRAVENOUS | Status: AC
  Administered 2015-05-24 (×2): 2 g via INTRAVENOUS
  Filled 2015-05-23 (×4): qty 100

## 2015-05-23 MED ORDER — ONDANSETRON HCL 4 MG/2ML IJ SOLN
INTRAMUSCULAR | Status: AC
  Administered 2015-05-23: 19:00:00
  Filled 2015-05-23: qty 2

## 2015-05-23 MED ORDER — KCL-LACTATED RINGERS-D5W 20 MEQ/L IV SOLN
INTRAVENOUS | Status: DC
  Administered 2015-05-23: 21:00:00 via INTRAVENOUS
  Filled 2015-05-23 (×6): qty 1000

## 2015-05-23 MED ORDER — ACETAMINOPHEN 650 MG RE SUPP: 650.0000 mg | Freq: Four times a day (QID) | RECTAL | Status: DC | PRN

## 2015-05-23 SURGICAL SUPPLY — 46 items
BIT DRILL 3.8X6 NS (BIT) ×2 IMPLANT
BIT DRILL 4.4 NS (BIT) ×2 IMPLANT
CANISTER SUCT 1200ML W/VALVE (MISCELLANEOUS) ×2 IMPLANT
CHLORAPREP W/TINT 26ML (MISCELLANEOUS) ×4 IMPLANT
DECANTER SPIKE VIAL GLASS SM (MISCELLANEOUS) ×2 IMPLANT
DRAPE C-ARM XRAY 36X54 (DRAPES) ×2 IMPLANT
DRAPE C-ARMOR (DRAPES) ×2 IMPLANT
DRAPE U-SHAPE 47X51 STRL (DRAPES) ×2 IMPLANT
DRSG AQUACEL AG ADV 3.5X 4 (GAUZE/BANDAGES/DRESSINGS) ×4 IMPLANT
DRSG AQUACEL AG ADV 3.5X10 (GAUZE/BANDAGES/DRESSINGS) ×2 IMPLANT
ELECT REM PT RETURN 9FT ADLT (ELECTROSURGICAL) ×2
ELECTRODE REM PT RTRN 9FT ADLT (ELECTROSURGICAL) ×1 IMPLANT
GAUZE PETRO XEROFOAM 1X8 (MISCELLANEOUS) ×2 IMPLANT
GAUZE SPONGE 4X4 12PLY STRL (GAUZE/BANDAGES/DRESSINGS) IMPLANT
GLOVE BIO SURGEON STRL SZ7.5 (GLOVE) ×6 IMPLANT
GLOVE INDICATOR 8.0 STRL GRN (GLOVE) ×2 IMPLANT
GLOVE SURG ORTHO 8.5 STRL (GLOVE) ×2 IMPLANT
GLOVE SURG XRAY 8.0 LX (GLOVE) ×2 IMPLANT
GOWN STRL REUS W/ TWL LRG LVL3 (GOWN DISPOSABLE) ×1 IMPLANT
GOWN STRL REUS W/TWL LRG LVL3 (GOWN DISPOSABLE) ×1
GOWN STRL REUS W/TWL LRG LVL4 (GOWN DISPOSABLE) ×2 IMPLANT
GUIDEPIN 3.2X17.5 THRD DISP (PIN) ×2 IMPLANT
GUIDEWIRE BALL NOSE 100CM (WIRE) IMPLANT
GUIDEWIRE BALL NOSE 80CM (WIRE) ×2 IMPLANT
HEMOVAC 400CC 10FR (MISCELLANEOUS) ×2 IMPLANT
KIT RM TURNOVER STRD PROC AR (KITS) ×2 IMPLANT
NAIL TIBIAL 10MMX37.5CM (Nail) ×2 IMPLANT
NEEDLE HYPO 27GX1-1/4 (NEEDLE) ×2 IMPLANT
NEEDLE SPNL 18GX3.5 QUINCKE PK (NEEDLE) ×2 IMPLANT
NEEDLE SPNL 20GX3.5 QUINCKE YW (NEEDLE) ×2 IMPLANT
NS IRRIG 1000ML POUR BTL (IV SOLUTION) ×2 IMPLANT
PACK TOTAL KNEE (MISCELLANEOUS) ×2 IMPLANT
PAD ABD DERMACEA PRESS 5X9 (GAUZE/BANDAGES/DRESSINGS) IMPLANT
PADDING CAST BLEND 6X4 STRL (MISCELLANEOUS) ×1 IMPLANT
PADDING STRL CAST 6IN (MISCELLANEOUS) ×1
SCREW ACECAP 42MM (Screw) ×2 IMPLANT
SCREW ACECAP 46MM (Screw) IMPLANT
SCREW PROXIMAL DEPUY (Screw) ×1 IMPLANT
SCREW PRXML FT 50X5.5XLCK NS (Screw) ×1 IMPLANT
SPONGE LAP 18X18 5 PK (GAUZE/BANDAGES/DRESSINGS) IMPLANT
STAPLER SKIN PROX 35W (STAPLE) ×2 IMPLANT
STOCKINETTE BIAS CUT 6 980064 (GAUZE/BANDAGES/DRESSINGS) ×2 IMPLANT
SUT VIC AB 0 CT1 36 (SUTURE) ×4 IMPLANT
SUT VIC AB 2-0 CT1 27 (SUTURE) ×2
SUT VIC AB 2-0 CT1 TAPERPNT 27 (SUTURE) ×2 IMPLANT
SYRINGE 10CC LL (SYRINGE) ×4 IMPLANT

## 2015-05-23 NOTE — Op Note (Signed)
05/23/2015  7:00 PM    Boykin PeekChristy, Maejor  [id number]   Pre-Op Dx:  Acute anaphylaxis with tongue swelling  Post-op Dx: Acute anaphylaxis with anterior tongue swelling but no involvement of the hypopharynx or larynx  Proc: Flexible laryngoscopy   Surg:  Kambre Messner H  Anes:  GOT  EBL:  None  Comp:  None  Findings:  No swelling the hypopharynx or larynx  Procedure: Patient was seen in the ICU emergently. The flexible laryngoscope was lubricated with K-Y jelly and then placed through the right nostril under direct vision into the hypopharynx. There is some septal deviation but the nasal airway was recently open. The nasopharynx was clear. The hypopharynx and tongue base were not swollen. The epiglottis was not swollen at all. There was no edema of the lateral walls of the hypopharynx. The arytenoids were not swollen. The vocal cords were slightly irritated but were not swollen at all. The laryngeal inlet was open and clear. He had good vocal cord mobility. There is no airway compromise.  Dispo:   This was verbalized to the anesthesiologist on call as well as the intensivist following the ICU  Plan:  To be followed in ICU. Does not need intubation.  Chaniqua Brisby H  05/23/2015 7:00 PM

## 2015-05-23 NOTE — Progress Notes (Signed)
1614 called into room by dietary aide. Upon entering room pt sitting up reports he could not breathe, Code called to room. Pt with nausea and vomiting. Epinepherine 0.3 mg given,  25mg  solumedrol, 50 mg Benadryl, 0.3mg  Epinepherine, 100mg  Solumedrol, 4mg  zofran, 50mg  zantac. Dr. Glenetta HewMcLaurin at bedside along with Respiratory, Raj JanusSupervisor,(Ann Thompson,RN), CCU RN, Dr. Luberta MutterKonidena. Pt tongue and lips swollen, complaining of inability to breathe. Pt transferred to CCU 7, Report given to charge nurse Pam, RN @ 1640.

## 2015-05-23 NOTE — Anesthesia Preprocedure Evaluation (Addendum)
Anesthesia Evaluation  Patient identified by MRN, date of birth, ID band Patient awake    Reviewed: Allergy & Precautions, NPO status , Patient's Chart, lab work & pertinent test results  History of Anesthesia Complications Negative for: history of anesthetic complications  Airway Mallampati: II       Dental   Pulmonary neg pulmonary ROS, COPD,  COPD inhaler, Current Smoker,           Cardiovascular hypertension, Pt. on medications      Neuro/Psych Anxiety negative neurological ROS     GI/Hepatic negative GI ROS, Neg liver ROS, GERD  Medicated and Controlled,  Endo/Other  negative endocrine ROS  Renal/GU negative Renal ROS     Musculoskeletal   Abdominal   Peds  Hematology   Anesthesia Other Findings   Reproductive/Obstetrics                            Anesthesia Physical Anesthesia Plan  ASA: III  Anesthesia Plan: Spinal   Post-op Pain Management:    Induction: Intravenous  Airway Management Planned: Nasal Cannula  Additional Equipment:   Intra-op Plan:   Post-operative Plan:   Informed Consent: I have reviewed the patients History and Physical, chart, labs and discussed the procedure including the risks, benefits and alternatives for the proposed anesthesia with the patient or authorized representative who has indicated his/her understanding and acceptance.     Plan Discussed with:   Anesthesia Plan Comments:         Anesthesia Quick Evaluation

## 2015-05-23 NOTE — Consult Note (Signed)
PULMONARY/CCM CONSULT NOTE  Requesting MD/Service: Miller/Orthopedics Date of initial consultation: 05/23/15 Reason for consultation: acute respiratory distress due to anaphylaxis and airway compromise  PT PROFILE: [age] M suffered traumatic R tib-fib fracture 05/16/15 and underwent ORIF 05/23/15. Procedure was uncomplicated and patient was transferred from PACU to ortho floor. Received morphine @1714  and hydrocodone @ 1746. Shortly thereafter developed respiratory distress with tongue and lip swelling. Transferred to ICU for possible intubation.   HPI:  As above. Prior to transfer to ICU, he received diphenhydramine, ranitidine and methylprednisolone. By the time of my arrival, he was lethargic and in no overt distress but reported a sensation of dyspnea. He had undergone laryngoscopy by Dr Elenore RotaJuengel who reported to me no laryngeal or posterior tongue swelling.   Past Medical History  Diagnosis Date  . Hypertension   . Hyperlipidemia   . ADHD (attention deficit hyperactivity disorder)   . Osteoporosis     Past Surgical History  Procedure Laterality Date  . Total hip arthroplasty      Left hip  . Bone graft hip iliac crest      left hip  . Craniotomy      for closed fontanella  . Wrist fracture surgery      Right wrist  . Tibia im nail insertion Right 05/23/2015    Procedure: INTRAMEDULLARY (IM) NAIL TIBIAL;  Surgeon: [name], MD;  Location: [facility] ORS;  Service: Orthopedics;  Laterality: Right;    MEDICATIONS: I have reviewed all outpt and inpt medications and confirmed regimen as documented  Social History   Social History  . Marital Status: Single    Spouse Name: N/A  . Number of Children: N/A  . Years of Education: N/A   Occupational History  . Not on file.   Social History Main Topics  . Smoking status: Current Every Day Smoker -- 1.00 packs/day    Types: Cigarettes  . Smokeless tobacco: Not on file  . Alcohol Use: 0.0 oz/week    0 Standard drinks or equivalent  per week     Comment: liquor 2x/week- last drink 4 days ago  . Drug Use: Yes    Special: Marijuana     Comment: inhales marijuana- last dip 4 days ago  . Sexual Activity: Not on file   Other Topics Concern  . Not on file   Social History Narrative   Lives alone.    Family History  Problem Relation Age of Onset  . CAD Mother     had CABG  . Prostate cancer Maternal Grandfather     ROS: No fever, myalgias/arthralgias, unexplained weight loss or weight gain No new focal weakness or sensory deficits No otalgia, hearing loss, visual changes, nasal and sinus symptoms, mouth and throat problems No neck pain or adenopathy No abdominal pain, N/V/D, diarrhea, change in bowel pattern No dysuria, change in urinary pattern No LE edema or calf tenderness   Filed Vitals:   05/23/15 1515 05/23/15 1528 05/23/15 1636 05/23/15 1732  BP:  121/84 116/68 140/89  Pulse:  65 85 79  Temp:      TempSrc:      Resp:  18 18 20   Height:      Weight:      SpO2: 100% 100% 99% 98%     EXAM:  Gen: Lethargic and somewhat irritable, No overt respiratory distress HEENT: NCATl, sclera white, Mild tongue swelling, dry oral mucosa Neck: Supple without LAN, thyromegaly, JVD Lungs: breath sounds full and slightly coarse, few scattered wheezes Cardiovascular:  Normal rate, reg rhythm, no murmurs noted Abdomen: Soft, nontender, normal BS Ext: without clubbing, cyanosis, edema, RLE fully casted and dressed Neuro: CNs grossly intact, motor and sensory intact, DTRs symmetric Skin: Limited exam, no lesions noted  DATA:   BMP Latest Ref Rng 05/23/2015 05/17/2015 05/16/2015  Glucose 65 - 99 mg/dL - 161(W) 960(A)  BUN 6 - 20 mg/dL - 12 15  Creatinine 5.40 - 1.24 mg/dL 9.81 1.91 4.78  Sodium 135 - 145 mmol/L - 133(L) 128(L)  Potassium 3.5 - 5.1 mmol/L - 3.4(L) 3.9  Chloride 101 - 111 mmol/L - 101 101  CO2 22 - 32 mmol/L - 27 23  Calcium 8.9 - 10.3 mg/dL - 7.9(L) 8.1(L)    CBC Latest Ref Rng 05/23/2015  05/17/2015 05/16/2015  WBC 3.8 - 10.6 K/uL 10.7(H) 4.4 6.4  Hemoglobin 13.0 - 18.0 g/dL 12.1(L) 13.1 14.9  Hematocrit 40.0 - 52.0 % 35.2(L) 38.7(L) 44.0  Platelets 150 - 440 K/uL 315 160 161    CXR:  NACPD  IMPRESSION:   - Post op R tib-fib ORIF - Acute respiratory distress - Anaphylaxis with tongue and throat swelling   - Received following medications: Clindamycin @ 1048, Cefazolin @ 1101, MSO4 @ 1714, hydrocodone @ 1746  - Symptoms first noted @ 1814 (wrongly documented as 1614 in RN's note)  - His records indicate a similar reaction to PCNs in past  - mostly likely culprits are MSO4 or hydrocodone based on timing but prior reaction to PCN is worrisome - Smoker on chronic bronchodilator therapy but without documented history of COPD or asthma - Mild hyponatremia - Mild hyokalemia  PLAN:  - Scheduled diphenhydramine 12.5 mg IV q 6 hrs X 4 doses - Famotidine 20 mg IV q 12 hrs X 4 doses - Methylprednisolone 80 mg IV q 12 hrs X 4 doses - Stop all potentially offending meds - Monitor in ICU at least through the night - Humidified O2 via face tent - NPO until AM 3/30 - then re-assess - IVFs adjusted. Recheck BMET in AM 03/30 - Fentanyl PRN pain (not associated with histamine release as morphine is) - Should probably undergo allergy assessment after discharge to determine whether this is cephalosporin-related or morphine-related    Billy Fischer, MD PCCM service Mobile [phone] Pager [phone] 05/23/2015

## 2015-05-23 NOTE — ED Provider Notes (Signed)
----------------------------------------- 6:15 PM on 05/23/2015 ----------------------------------------- I responded to code blue called  [facility]  Department of Emergency Medicine   Code Blue CONSULT NOTE  Chief Complaint: Cardiac arrest/unresponsive   Level V Caveat: difficulty breathing/speaking  History of present illness: I was contacted by the hospital for a CODE BLUE cardiac arrest upstairs and presented to the patient's bedside.   Pt reported acute difficulty breathing & vomiting with swelling of lips, tongue, & sensation of swelling of throat.  On my arrival, pt was vomiting, able to whisper, saying "help me"; he was tachycardic in the 140s, normotensive SBP 120, normoxic, SaO2 upper 90s.  He had gone to the OR today for ORIF tibia fx & had received clindamycin around 5p, morphine, & 20 min prior to Sx onset, po hydrocodone & xanax.  Sx c/w anaphylaxis.  ROS: Unable to obtain, Level V caveat  Scheduled Meds: Continuous Infusions: PRN Meds:. Past Medical History  Diagnosis Date  . Hypertension   . Hyperlipidemia   . ADHD (attention deficit hyperactivity disorder)   . Osteoporosis    Past Surgical History  Procedure Laterality Date  . Total hip arthroplasty      Left hip  . Bone graft hip iliac crest      left hip  . Craniotomy      for closed fontanella  . Wrist fracture surgery      Right wrist  . Tibia im nail insertion Right 05/23/2015    Procedure: INTRAMEDULLARY (IM) NAIL TIBIAL;  Surgeon: Earnestine Leys, MD;  Location: [facility] ORS;  Service: Orthopedics;  Laterality: Right;   Social History   Social History  . Marital Status: Single    Spouse Name: N/A  . Number of Children: N/A  . Years of Education: N/A   Occupational History  . Not on file.   Social History Main Topics  . Smoking status: Current Every Day Smoker -- 1.00 packs/day    Types: Cigarettes  . Smokeless tobacco: Not on file  . Alcohol Use: 0.0 oz/week    0 Standard  drinks or equivalent per week     Comment: liquor 2x/week- last drink 4 days ago  . Drug Use: Yes    Special: Marijuana     Comment: inhales marijuana- last dip 4 days ago  . Sexual Activity: Not on file   Other Topics Concern  . Not on file   Social History Narrative   Lives alone.   Allergies  Allergen Reactions  . Penicillins Swelling, Rash and Other (See Comments)    Reaction:  Tongue swelling  Has patient had a PCN reaction causing immediate rash, facial/tongue/throat swelling, SOB or lightheadedness with hypotension: Yes Has patient had a PCN reaction causing severe rash involving mucus membranes or skin necrosis: No Has patient had a PCN reaction that required hospitalization No Has patient had a PCN reaction occurring within the last 10 years: No If all of the above answers are "NO", then may proceed with Cephalosporin use.    Last set of Vital Signs (not current) Filed Vitals:   05/17/15 1206 05/17/15 1500  BP: 100/62 106/68  Pulse:  78  Temp:    Resp:        Physical Exam Gen: speaking "help me" in whispers HEENT: swollen tongue & lips; unable to visualize soft palate Cardiovascular: tachy Resp: tachypneic; Musculoskeletal: R leg in cast Skin: warm; pale; diaphoretic  CRITICAL CARE Performed by: Vicktoria Muckey,  Jovi Alvizo   Total critical care time: 40 minutes  Critical care time was exclusive of separately billable procedures and treating other patients.  Critical care was necessary to treat or prevent imminent or life-threatening deterioration.  Critical care was time spent personally by me on the following activities: development of treatment plan with patient and/or surrogate as well as nursing, discussions with consultants, evaluation of patient's response to treatment, examination of patient, obtaining history from patient or surrogate, ordering and performing treatments and interventions, ordering and review of laboratory studies, ordering and review of  radiographic studies, pulse oximetry and re-evaluation of patient's condition.  -epi 0.3 mg IM given, solumedrol 146m, benadryl 574m zantac 5049mrdered from pharmacy; pt felt little relief, but was still protecting his airway; repeat epi 0.3 mg IM -I d/w Dr. YueMaris BergerNT, who is coming in to evaluate pt as I am concerned he could need intubation & will be a difficult airway -pharmacy unable to locate more epi pens -pt xfered to ICU bed 4 -Dr. PigAmbrose Pancoastnesthesiologist notified & met us [place] the ICU -Dr. YueMaris Berger ICU; plans to evaluate pt with fiberoptic laryngoscope -ICU drawing up 3rd epi -Dr. YueMaris BergerDr. PigAmbrose Pancoastsumed immediate care of pt at 6:5[place] 05/23/15 1913

## 2015-05-23 NOTE — Transfer of Care (Signed)
Immediate Anesthesia Transfer of Care Note  Patient: Theodore Garcia  Procedure(s) Performed: Procedure(s): INTRAMEDULLARY (IM) NAIL TIBIAL (Right)  Patient Location: PACU  Anesthesia Type:Spinal  Level of Consciousness: awake, alert  and oriented  Airway & Oxygen Therapy: Patient Spontanous Breathing and Patient connected to face mask oxygen  Post-op Assessment: Report given to RN  Post vital signs: Reviewed and stable  Last Vitals:  Filed Vitals:   05/23/15 0940 05/23/15 1341  BP: 127/84 114/74  Pulse: 98 79  Temp: 36.8 C 36.8 C  Resp: 16 19    Complications: No apparent anesthesia complications

## 2015-05-23 NOTE — Anesthesia Procedure Notes (Addendum)
Performed by: XNTZGYF, Shateka Petrea Oxygen Delivery Method: Simple face mask    Spinal Patient location during procedure: OR Start time: 05/23/2015 11:10 AM End time: 05/23/2015 11:25 AM Staffing Performed by: anesthesiologist  Preanesthetic Checklist Completed: patient identified, site marked, surgical consent, pre-op evaluation, timeout performed, IV checked, risks and benefits discussed and monitors and equipment checked Spinal Block Patient position: sitting Prep: Betadine Patient monitoring: heart rate, continuous pulse ox, blood pressure and cardiac monitor Approach: midline Location: L4-5 Injection technique: single-shot Needle Needle type: Whitacre and Introducer  Needle gauge: 24 G Needle length: 12.7 cm Needle insertion depth: 10 cm Additional Notes Negative paresthesia. Negative blood return. Positive free-flowing CSF. Expiration date of kit checked and confirmed. Patient tolerated procedure well, without complications.    Performed by: Kensley Valladares Ventilation: Nasal airway inserted- appropriate to patient size

## 2015-05-23 NOTE — Anesthesia Postprocedure Evaluation (Signed)
Anesthesia Post Note  Patient: Theodore Garcia  Procedure(s) Performed: Procedure(s) (LRB): INTRAMEDULLARY (IM) NAIL TIBIAL (Right)  Patient location during evaluation: PACU Anesthesia Type: General Level of consciousness: awake and alert Pain management: pain level controlled Vital Signs Assessment: post-procedure vital signs reviewed and stable Respiratory status: spontaneous breathing and respiratory function stable Cardiovascular status: stable Anesthetic complications: no    Last Vitals:  Filed Vitals:   05/23/15 0940 05/23/15 1341  BP: 127/84 114/74  Pulse: 98 79  Temp: 36.8 C 36.8 C  Resp: 16 19    Last Pain:  Filed Vitals:   05/23/15 1358  PainSc: 0-No pain                 KEPHART,WILLIAM K

## 2015-05-23 NOTE — H&P (Signed)
THE PATIENT WAS SEEN PRIOR TO SURGERY TODAY.  HISTORY, ALLERGIES, HOME MEDICATIONS AND OPERATIVE PROCEDURE WERE REVIEWED. RISKS AND BENEFITS OF SURGERY DISCUSSED WITH PATIENT AGAIN.  NO CHANGES FROM INITIAL HISTORY AND PHYSICAL NOTED.    

## 2015-05-23 NOTE — Progress Notes (Signed)
Notified Dr. Hyacinth MeekerMiller that patient had strike through on his surgical dressing. Dressing reinforced with ABD x2 and curlex. Patient also requesting a nicotine patch. Order received for 14 MG Nicotine patch.

## 2015-05-23 NOTE — Consult Note (Signed)
Theodore Garcia, Theodore Garcia [id number] 09/06/1970 Theodore SaintHoward Miller, MD  Reason for Consult: Acute anaphylaxis involving the oral airway  HPI: I should say 45 year old white male who had surgery this 4 repair of a right knee leg injury. He was doing fine until partially 1 hour ago when he started having acute swelling of his lips and anterior tongue. He apparently had been given clindamycin around 5:20 along with morphine about 6 PM. Shortly after that he started complaining of symptoms and was evaluated by the emergency room doctor. He had lip swelling and anterior tongue swelling that was progressing rapidly. Received 2 doses of epinephrine and a dose of Solu-Medrol while some IV Benadryl was being brought up from the pharmacy. His transfer directly to the ICU for better management of his airway. I was called emergently to come evaluate airway. He is able to speak and is currently breathing on his own. He has not had any acute allergic reaction to pain medication before but he has had allergy to penicillin.  Allergies:  Allergies  Allergen Reactions  . Penicillins Swelling, Rash and Other (See Comments)    Reaction:  Tongue swelling  Has patient had a PCN reaction causing immediate rash, facial/tongue/throat swelling, SOB or lightheadedness with hypotension: Yes Has patient had a PCN reaction causing severe rash involving mucus membranes or skin necrosis: No Has patient had a PCN reaction that required hospitalization No Has patient had a PCN reaction occurring within the last 10 years: No If all of the above answers are "NO", then may proceed with Cephalosporin use.    ROS: Review of systems normal other than 12 systems except per HPI.  PMH:  Past Medical History  Diagnosis Date  . Hypertension   . Hyperlipidemia   . ADHD (attention deficit hyperactivity disorder)   . Osteoporosis     FH:  Family History  Problem Relation Age of Onset  . CAD Mother     had CABG  . Prostate cancer Maternal  Grandfather     SH:  Social History   Social History  . Marital Status: Single    Spouse Name: N/A  . Number of Children: N/A  . Years of Education: N/A   Occupational History  . Not on file.   Social History Main Topics  . Smoking status: Current Every Day Smoker -- 1.00 packs/day    Types: Cigarettes  . Smokeless tobacco: Not on file  . Alcohol Use: 0.0 oz/week    0 Standard drinks or equivalent per week     Comment: liquor 2x/week- last drink 4 days ago  . Drug Use: Yes    Special: Marijuana     Comment: inhales marijuana- last dip 4 days ago  . Sexual Activity: Not on file   Other Topics Concern  . Not on file   Social History Narrative   Lives alone.    PSH:  Past Surgical History  Procedure Laterality Date  . Total hip arthroplasty      Left hip  . Bone graft hip iliac crest      left hip  . Craniotomy      for closed fontanella  . Wrist fracture surgery      Right wrist  . Tibia im nail insertion Right 05/23/2015    Procedure: INTRAMEDULLARY (IM) NAIL TIBIAL;  Surgeon: Theodore SaintHoward Miller, MD;  Location: [facility] ORS;  Service: Orthopedics;  Laterality: Right;    Physical  Exam: Patient is alert and responds to questions appropriately. He has some swelling of  his upper and lower lips that is a fetus watery edema involving all of the lip. Oral cavity shows some watery edema of his anterior floor mouth with some elevation of the anterior tongue. the posterior tongue feels soft and his uvula does not have any swelling, ororpharynx normal with no masses or lesions. Skin warm and dry without rash . Neck supple with no masses or lesions. No lymphadenopathy palpated. Thyroid normal with no masses. Flexible laryngoscopy was done and this is dictated in detail elsewhere. The scope was placed through his right nostril to visualize the hypopharynx and larynx. The nose and nasopharynx were relatively clear. The tongue base was not swollen at all. The uvula was not swollen. The  epiglottis was not swollen. There was no swelling of the arytenoids or area epiglottic folds. The vocal cords were slightly red but had good mobility and opened widely and the subglottic space was clear there is no swelling at the larynx in the true or false cords. There is no edema of the posterior pharyngeal wall and the airway was clear.   A/P: Acute anaphylaxis likely from morphine although this is not certain. He needs to avoid that for now. He seems to have turnaround and does not need intubation currently. He should remain on IV Benadryl 50 mg every 4 hours until there is no further swelling. He needs to be watched in the ICU overnight to make sure there is no further swelling in his lungs are and clear, but if he is doing well in the morning he can be transferred back to the floor. He can switch to oral Benadryl or oral cetirizine help prevent further flareups of allergy until were more certain what the allergic source was.   Theodore Garcia H 05/23/2015 7:07 PM

## 2015-05-23 NOTE — Progress Notes (Signed)

## 2015-05-23 NOTE — Op Note (Signed)
Expand All Collapse All     05/23/2015  1:34 PM  PATIENT:  Theodore Garcia   MRN: [id number]  PRE-OPERATIVE DIAGNOSIS:  Right Tibial Fracture  POST-OPERATIVE DIAGNOSIS:  Right Tibial Fracture   PROCEDURE:  Procedure(s): INTRAMEDULLARY (IM) NAIL TIBIAL  SURGEON: Valinda HoarHOWARD E Avila Albritton, MD  ANESTHESIA: Spinal   PREOPERATIVE INDICATIONS: Leonette MostCharles is a 45 y.o.male with a diagnosis of displaced and unstable tibia/ fibula fractures who elected for surgical management after discussion with the patient   about the options between surgery and cast management of the fractures. .  The risks benefits and alternatives were discussed with the patient preoperatively including but not limited to the risks of infection, bleeding, nerve injury, cardiopulmonary complications, the need for revision surgery, among others, and the patient was willing to proceed.  OPERATIVE IMPLANTS: Biomet Versanail, 10 mm 37.5 cm  OPERATIVE FINDINGS: Unstable distal tibia fracture  COMPLICATIONS: None  EBL: 50 cc REPLACED: None  OPERATIVE PROCEDURE: The patient was brought to the operating room and underwent spinal anesthesia without complications and then placed on the operating room table and positioned appropriately. The operative leg was prepped and draped in a sterile fashion. Tourniquet was not used. IV anti-biotics were given. The leg was placed on the tibial reduction triangle and the foot immobilized with Coban and traction and rotational corrections were made. A proximal medial incision was made along the patellar tendon. Dissection was carried out bluntly through subcutaneous tissue and the fascia was divided. A guidepin was introduced into the proximal tibia under fluoroscopic control and seen to be in good alignment and position. Large drill was introduced to open the the proximal tibia. A guidepin was then passed down the shaft of the tibia and across the fracture site down to the lower end of  the tibia. The shaft was then sequentially reamed to 11 mm. A 10 mm x 37.5 cm Versanail was introduced and passed across the fracture site and fully seated in the tibia. Fluoroscopy showed excellent position of the nail and that the fracture had been well reduced. Length was excellent. 1 proximal oblique drill hole was made and filled with 5.5 screw. This was in good position on fluoroscopy. Distally, a medial/ lateral screw was introduced and seated fully. This was in good position on fluoroscopy also. The wounds were then irrigated. The knee fascia was closed with 2-0 Vicryls and the skin was closed with 3-0 Vicryls. All wounds were closed with staples. Aquacel and Xeroform covered by dressing sponges were applied.Sponge and needle counts were correct.   A well-padded posterior splint was applied. The patient was transferred to the hospital bed and taken to recovery room in good condition.  Valinda HoarHoward E Sandon Yoho, MD

## 2015-05-24 DIAGNOSIS — T783XXA Angioneurotic edema, initial encounter: Secondary | ICD-10-CM

## 2015-05-24 DIAGNOSIS — K130 Diseases of lips: Secondary | ICD-10-CM

## 2015-05-24 DIAGNOSIS — R220 Localized swelling, mass and lump, head: Secondary | ICD-10-CM

## 2015-05-24 LAB — BASIC METABOLIC PANEL
ANION GAP: 6 (ref 5–15)
BUN: 14 mg/dL (ref 6–20)
CHLORIDE: 107 mmol/L (ref 101–111)
CO2: 22 mmol/L (ref 22–32)
Calcium: 8.3 mg/dL — ABNORMAL LOW (ref 8.9–10.3)
Creatinine, Ser: 0.91 mg/dL (ref 0.61–1.24)
GFR calc Af Amer: >60 mL/min (ref >60)
GFR calc non Af Amer: >60 mL/min (ref >60)
GLUCOSE: 173 mg/dL — AB (ref 65–99)
POTASSIUM: 4.1 mmol/L (ref 3.5–5.1)
Sodium: 135 mmol/L (ref 135–145)

## 2015-05-24 MED ORDER — IPRATROPIUM BROMIDE 0.02 % IN SOLN: 0.5000 mg | Freq: Four times a day (QID) | RESPIRATORY_TRACT | Status: DC | PRN

## 2015-05-24 MED ORDER — BUDESONIDE 0.25 MG/2ML IN SUSP
0.2500 mg | Freq: Two times a day (BID) | RESPIRATORY_TRACT | Status: DC
  Administered 2015-05-25: 0.25 mg via RESPIRATORY_TRACT
  Filled 2015-05-24: qty 2

## 2015-05-24 MED ORDER — TRAMADOL HCL 50 MG PO TABS
50.0000 mg | ORAL_TABLET | Freq: Four times a day (QID) | ORAL | Status: DC | PRN
  Administered 2015-05-24: 100 mg via ORAL
  Administered 2015-05-24 – 2015-05-25 (×2): 50 mg via ORAL
  Filled 2015-05-24 (×2): qty 1
  Filled 2015-05-24: qty 2

## 2015-05-24 MED ORDER — HYDROCODONE-ACETAMINOPHEN 7.5-325 MG PO TABS
1.0000 | ORAL_TABLET | Freq: Four times a day (QID) | ORAL | Status: DC | PRN
  Administered 2015-05-24 (×2): 1 via ORAL
  Administered 2015-05-24 – 2015-05-25 (×3): 2 via ORAL
  Filled 2015-05-24: qty 1
  Filled 2015-05-24 (×2): qty 2
  Filled 2015-05-24: qty 1
  Filled 2015-05-24: qty 2

## 2015-05-24 MED FILL — Medication: Qty: 1 | Status: AC

## 2015-05-24 NOTE — Progress Notes (Signed)
MD Hyacinth MeekerMiller paged, pt needs PT orders and PRN pain medicine.  MD returned paged. Orders received for [facility] PT, and  Norco 7-325 mg Q6 PRN. Orders placed.

## 2015-05-24 NOTE — Progress Notes (Signed)
PULMONARY / CRITICAL CARE MEDICINE   Name: Theodore Garcia MRN: [id number] DOB: 09-24-1970    ADMISSION DATE:  05/23/2015  BRIEF HISTORY: 33 M suffered traumatic R tib-fib fracture 05/16/15 and underwent ORIF 05/23/15. Procedure was uncomplicated and patient was transferred from PACU to ortho floor. Received morphine  and hydrocodone @ 1746. Shortly thereafter developed respiratory distress with tongue and lip swelling. Transferred to ICU for possible intubation. ENT eval no tongue base swelling, no uvula swelling, no epiglottis swelling, no arytenoids/epiglottic fold swelling, no laryngeal swelling, open subglottic space; no need for emergent intubation.   SUBJECTIVE:    STUDIES:    SIGNIFICANT EVENTS: 3/30>Right tib-fib fx with ORIF, received morphine and hydrocodone after surgery, develop lip and  Anterior tongue swelling, transferred to the ICU for closer monitoring.     VITAL SIGNS: Temp:  [97.4 F (36.3 C)-98.8 F (37.1 C)] 97.8 F (36.6 C) (03/30 0803) Pulse Rate:  [57-133] 91 (03/30 0750) Resp:  [8-27] 17 (03/30 0750) BP: (84-141)/(52-89) 122/85 mmHg (03/30 0750) SpO2:  [88 %-100 %] 97 % (03/30 0750) FiO2 (%):  [21 %-40 %] 40 % (03/29 2000) Weight:  [198 lb (89.812 kg)] 198 lb (89.812 kg) (03/29 0940) HEMODYNAMICS:   VENTILATOR SETTINGS: Vent Mode:  [-]  FiO2 (%):  [21 %-40 %] 40 % INTAKE / OUTPUT:  Intake/Output Summary (Last 24 hours) at 05/24/15 0810 Last data filed at 05/24/15 0800  Gross per 24 hour  Intake 2051.25 ml  Output    650 ml  Net 1401.25 ml    Review of Systems  Constitutional: Negative for fever, chills and weight loss.  HENT: Negative for hearing loss.   Eyes: Negative for blurred vision and double vision.  Respiratory: Negative for cough.   Cardiovascular: Negative for chest pain.  Gastrointestinal: Negative for heartburn and nausea.  Genitourinary: Negative for dysuria and urgency.  Musculoskeletal: Positive for joint pain.        R knee/leg pain   Neurological: Negative for dizziness and headaches.  Endo/Heme/Allergies: Does not bruise/bleed easily.    Physical Exam  Constitutional: He is oriented to person, place, and time and well-developed, well-nourished, and in no distress.  HENT:  Head: Normocephalic and atraumatic.  Right Ear: External ear normal.  Left Ear: External ear normal.  Nose: Nose normal.  Mouth/Throat: Oropharynx is clear and moist. No oropharyngeal exudate.  No anterior tongue swelling Lips not swollen  Eyes: Conjunctivae and EOM are normal. Pupils are equal, round, and reactive to light.  Neck: Normal range of motion. Neck supple.  Cardiovascular: Normal rate, normal heart sounds and intact distal pulses.   No murmur heard. Pulmonary/Chest: Effort normal and breath sounds normal. No respiratory distress. He has no wheezes. He has no rales. He exhibits no tenderness.  Abdominal: Soft. Bowel sounds are normal. He exhibits no distension. There is no tenderness.  Musculoskeletal: He exhibits no edema.  Right knee/leg with bandage s/p ORIF  Neurological: He is alert and oriented to person, place, and time.  Skin: Skin is warm and dry.  Psychiatric: Affect normal.  Nursing note and vitals reviewed.    LABS:  CBC  Recent Labs Lab 05/23/15 1707  WBC 10.7*  HGB 12.1*  HCT 35.2*  PLT 315   Coag's No results for input(s): APTT, INR in the last 168 hours. BMET  Recent Labs Lab 05/23/15 1707 05/24/15 0413  NA  --  135  K  --  4.1  CL  --  107  CO2  --  22  BUN  --  14  CREATININE 0.86 0.91  GLUCOSE  --  173*   Electrolytes  Recent Labs Lab 05/24/15 0413  CALCIUM 8.3*   Sepsis Markers No results for input(s): LATICACIDVEN, PROCALCITON, O2SATVEN in the last 168 hours. ABG No results for input(s): PHART, PCO2ART, PO2ART in the last 168 hours. Liver Enzymes No results for input(s): AST, ALT, ALKPHOS, BILITOT, ALBUMIN in the last 168 hours. Cardiac Enzymes No results  for input(s): TROPONINI, PROBNP in the last 168 hours. Glucose No results for input(s): GLUCAP in the last 168 hours.  Imaging Dg Chest Port 1 View  05/23/2015  CLINICAL DATA:  Coated approximately 1830 hours due to anaphylactic reaction, now responsive, respiratory failure, shortness of breath, history hypertension, smoking EXAM: PORTABLE CHEST 1 VIEW COMPARISON:  Portable exam 1925 hours compared to 12/27/2008 FINDINGS: Upper normal heart size. Normal mediastinal contours. Question subtle LEFT perihilar infiltrate. No pleural effusion or pneumothorax. Bones unremarkable. IMPRESSION: Question subtle LEFT perihilar infiltrate. Electronically Signed   By: Ulyses SouthwardMark  Boles M.D.   On: 05/23/2015 19:41   Dg Tibia/fibula Right Port  05/23/2015  CLINICAL DATA:  Status post tibial fixation EXAM: PORTABLE RIGHT TIBIA AND FIBULA - 2 VIEW COMPARISON:  05/16/2015 FINDINGS: A medullary rod is now seen in the tibia with proximal and distal fixation screws. The fracture fragments are better aligned. The fibular fracture is also well aligned. No new focal abnormality is seen. IMPRESSION: Status post ORIF of right tibial fracture. Electronically Signed   By: Alcide CleverMark  Lukens M.D.   On: 05/23/2015 15:22   Dg C-arm 61-120 Min-no Report  05/23/2015  CLINICAL DATA: surgery C-ARM 61-120 MINUTES Fluoroscopy was utilized by the requesting physician.  No radiographic interpretation.      ASSESSMENT / PLAN: 45 yo with R tib-fib fx s/p ORIF, received morphine and developed lip and anterior tongue swelling, transferred to ICU for closer monitoring.  - Post op R tib-fib ORIF - Acute respiratory distress - Anaphylaxis with tongue and throat swelling  - Received following medications: Clindamycin @ 1048, Cefazolin @ 1101, MSO4 @ 1714, hydrocodone @ 1746 - Symptoms first noted @ 1814 (wrongly documented as 1614 in RN's note) - His records indicate a similar reaction to PCNs in  past - mostly likely culprits are MSO4 or hydrocodone based on timing but prior reaction to PCN is worrisome - Smoker on chronic bronchodilator therapy but without documented history of COPD or asthma - Mild hyponatremia - resolved - Mild hyokalemia - resolved.   PLAN:  - Scheduled diphenhydramine 12.5 mg IV q 6 hrs X 4 doses - Famotidine 20 mg IV q 12 hrs X 4 doses - Methylprednisolone 80 mg IV q 12 hrs X 4 doses - Humidified O2 via face tent - can resume regular diet - Fentanyl PRN pain (not associated with histamine release as morphine is) - Should probably undergo allergy assessment after discharge to determine whether this is cephalosporin-related or morphine-related   Thank you for consulting [facility], we will signoff at this time.  Please feel free to contact us with any questions at [phone] (please enter 7-digits).    I have personally obtained a history, examined the patient, evaluated laboratory and imaging results, formulated the assessment and plan and placed orders.  Critical Care Time devoted to patient care services described in this note is 35 minutes.    Stephanie AcreVishal Trask Vosler, MD [facility] Pager [phone]- [phone] (please enter 7-digits)  On Call Pager - [phone] (please enter 7-digits)  Note: This note was prepared with Dragon dictation along with smaller phrase technology. Any transcriptional errors that result from this process are unintentional.

## 2015-05-24 NOTE — Progress Notes (Signed)
Telephone report called to Ana,RN.  Patient to be transferred to room 154.

## 2015-05-24 NOTE — Progress Notes (Signed)
Spoke to Dr. [name] about patient's slight ooze from dressing overnight from right leg surgical site and patient's request to eat.  Dr. Hyacinth MeekerMiller not concerned about bleeding and order patient to have regular diet.

## 2015-05-24 NOTE — Evaluation (Addendum)
Physical Therapy Evaluation Patient Details Name: Theodore Garcia MRN: [id number] DOB: 1970/06/20 Today's Date: 05/24/2015   History of Present Illness  Theodore Garcia  is a 45 y.o. 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 Pt with recent R tibial fracture on 05/16/15 after fall during ambulation to bathroom. Now s/p R LE ORIF on 3.29.17. Post sx, pt with allergic reaction to pain meds, sent to CCU for closer monitoring. New order received and cleared to work with pt, per RN.   Clinical Impression  Pt is a pleasant 45 year old male who was admitted for R LE ORIF s/p tib fx. Pt performs bed mobility independently, transfers with mod I using SW, and ambulation with cga and SW. Pt rates high pain with all mobility, RN aware. Pt very impulsive with mobility, SW is safest AD at this time as pt is high fall risk. Able to maintain correct WB status with all mobility. Pt demonstrates deficits with strength/mobility/pain. Would benefit from skilled PT to address above deficits and promote optimal return to PLOF. Needs to do stairs in AM prior to dc home.      Follow Up Recommendations Outpatient PT    Equipment Recommendations  None recommended by PT (has SW from previous admission)    Recommendations for Other Services       Precautions / Restrictions Precautions Precautions: Fall Restrictions Weight Bearing Restrictions: Yes RLE Weight Bearing: Non weight bearing      Mobility  Bed Mobility Overal bed mobility: Independent             General bed mobility comments: Good speed and squencing with HOB flat and no bed rails  Transfers Overall transfer level: Modified independent Equipment used: Standard walker             General transfer comment: Pt demonstrates good speed, sequencing, and stability with transfers. Slightly impulsive but no overt LOB. Able to  maintain correct WB status  Ambulation/Gait Ambulation/Gait assistance: Min guard Ambulation Distance (Feet): 100 Feet Assistive device: Standard walker Gait Pattern/deviations: Step-to pattern     General Gait Details: Pt very impulsive with ambulation and requires sequencing and cues for safety. Pt with increased SW distance away from body increasing falls risk  Stairs            Wheelchair Mobility    Modified Rankin (Stroke Patients Only)       Balance Overall balance assessment: Needs assistance Sitting-balance support: Feet supported Sitting balance-Leahy Scale: Normal     Standing balance support: Bilateral upper extremity supported Standing balance-Leahy Scale: Good                               Pertinent Vitals/Pain Pain Assessment: 0-10 Pain Score: 9  Pain Location: R LE Pain Descriptors / Indicators: Operative site guarding Pain Intervention(s): Limited activity within patient's tolerance;Patient requesting pain meds-RN notified    Home Living Family/patient expects to be discharged to:: Private residence Living Arrangements: Alone Available Help at Discharge: Family Type of Home: House Home Access: Stairs to enter Entrance Stairs-Rails: Can reach both Entrance Stairs-Number of Steps: 5 Home Layout: One level Home Equipment: Walker - standard      Prior Function Level of Independence: Independent with assistive device(s) (using SW since previous admission)               Hand  Dominance        Extremity/Trunk Assessment   Upper Extremity Assessment: Overall WFL for tasks assessed           Lower Extremity Assessment: Generalized weakness (R LE grossly 3+/5; L LE grossly WNL)         Communication   Communication: No difficulties  Cognition Arousal/Alertness: Awake/alert Behavior During Therapy: Impulsive Overall Cognitive Status: Within Functional Limits for tasks assessed                       General Comments      Exercises Other Exercises Other Exercises: Pt able to perform supine ther-ex on B LE including quad sets, SLRs, and hip abd/add. L LE also performed ankle pumps. All ther-ex performed x 10 reps with cues for sequencing and min assist for R LE.      Assessment/Plan    PT Assessment Patient needs continued PT services  PT Diagnosis Difficulty walking;Abnormality of gait;Acute pain   PT Problem List Decreased strength;Decreased activity tolerance;Decreased mobility;Decreased safety awareness;Pain  PT Treatment Interventions DME instruction;Gait training;Stair training;Therapeutic activities;Therapeutic exercise;Balance training;Patient/family education   PT Goals (Current goals can be found in the Care Plan section) Acute Rehab PT Goals Patient Stated Goal: to get stronger PT Goal Formulation: With patient Time For Goal Achievement: 06/07/15 Potential to Achieve Goals: Good    Frequency BID   Barriers to discharge Decreased caregiver support      Co-evaluation               End of Session Equipment Utilized During Treatment: Gait belt Activity Tolerance: Patient tolerated treatment well Patient left: in chair;with chair alarm set Nurse Communication: Mobility status         Time: [id number] PT Time Calculation (min) (ACUTE ONLY): 21 min   Charges:   PT Evaluation $PT Eval Moderate Complexity: 1 Procedure PT Treatments $Therapeutic Exercise: 8-22 mins   PT G Codes:        Theodore Garcia 05/24/2015, 4:53 PM Theodore Garcia, PT, DPT [phone]

## 2015-05-24 NOTE — Progress Notes (Signed)
Pt has taken face tent off. SPO2 on room air is 95%. Will cont. To monitor.

## 2015-05-24 NOTE — Progress Notes (Signed)
Subjective: 1 Day Post-Op Procedure(s) (LRB): INTRAMEDULLARY (IM) NAIL TIBIAL (Right)    Patient reports pain as moderate. Had allergic reaction to medication yesterday and was in ICU for observation overnight.  Back in room now.  Dressing moderate drainage at knee--will change this portion.  csm good distally. Probably Cleocin allergy since it was started 15-20 minutes prior to episode.    Objective:   VITALS:   Filed Vitals:   05/24/15 1021 05/24/15 1518  BP: 125/67 139/89  Pulse: 82 95  Temp: 97.9 F (36.6 C) 98.3 F (36.8 C)  Resp: 18 18    Neurologically intact Neurovascular intact Sensation intact distally Intact pulses distally Dorsiflexion/Plantar flexion intact  LABS  Recent Labs  05/23/15 1707  HGB 12.1*  HCT 35.2*  WBC 10.7*  PLT 315     Recent Labs  05/23/15 1707 05/24/15 0413  NA  --  135  K  --  4.1  BUN  --  14  CREATININE 0.86 0.91  GLUCOSE  --  173*    No results for input(s): LABPT, INR in the last 72 hours.   Assessment/Plan: 1 Day Post-Op Procedure(s) (LRB): INTRAMEDULLARY (IM) NAIL TIBIAL (Right)   Advance diet Up with therapy D/C IV fluids Plan for discharge tomorrow

## 2015-05-24 NOTE — Addendum Note (Signed)
Addendum  created 05/24/15 0751 by Mathews ArgyleBenjamin Djibril Glogowski, CRNA   Modules edited: Clinical Notes   Clinical Notes:  File: [id number]

## 2015-05-24 NOTE — Progress Notes (Signed)
PT Cancellation Note  Patient Details Name: Theodore Garcia MRN: [id number] DOB: 07/15/1970   Cancelled Treatment:    Reason Eval/Treat Not Completed: Medical issues which prohibited therapy. Pt received Sx yesterday and orders for PT consult. Post Sx, pt had allergic response to pain meds, sent to CCU via Code. No intubation noted. Will dc current orders. Need new orders to resume therapy services.   Khaleel Beckom 05/24/2015, 10:39 AM  Elizabeth PalauStephanie Dixie Coppa, PT, DPT [phone]

## 2015-05-24 NOTE — Anesthesia Postprocedure Evaluation (Signed)
Anesthesia Post Note  Patient: Theodore Garcia  Procedure(s) Performed: Procedure(s) (LRB): INTRAMEDULLARY (IM) NAIL TIBIAL (Right)  Patient location during evaluation: ICU Anesthesia Type: Spinal Level of consciousness: awake Pain management: satisfactory to patient Vital Signs Assessment: post-procedure vital signs reviewed and stable Respiratory status: spontaneous breathing and nonlabored ventilation Cardiovascular status: blood pressure returned to baseline Postop Assessment: no headache and no backache Anesthetic complications: no    Last Vitals:  Filed Vitals:   05/24/15 0500 05/24/15 0600  BP: 107/64 118/82  Pulse: 74 70  Temp: 36.8 C   Resp: 11 12    Last Pain:  Filed Vitals:   05/24/15 0733  PainSc: 0-No pain                 Mathews ArgyleLogan,  [name] P

## 2015-05-25 MED ORDER — HYDROCODONE-ACETAMINOPHEN 7.5-325 MG PO TABS: 1.0000 | ORAL_TABLET | Freq: Four times a day (QID) | ORAL | Status: DC | PRN

## 2015-05-25 MED ORDER — FAMOTIDINE 20 MG PO TABS
20.0000 mg | ORAL_TABLET | Freq: Two times a day (BID) | ORAL | Status: DC
  Administered 2015-05-25: 20 mg via ORAL
  Filled 2015-05-25: qty 1

## 2015-05-25 MED ORDER — GABAPENTIN 400 MG PO CAPS: 400.0000 mg | ORAL_CAPSULE | Freq: Two times a day (BID) | ORAL | Status: DC

## 2015-05-25 NOTE — Care Management (Addendum)
I spoke with with patient's mother  Gilford RileOrrico,Mary F Mother [phone]   She requests Knee Scooter which has been requested from Will with [facility]. PT will need to evaluate patient safety. Dr. Hyacinth MeekerMiller aware of Rx need. Mother agrees to outpatient PT at Dr. Rondel BatonMiller's office. She was appreciative of delivery of rolling walker 05/18/15- [facility]  PT said patient is not safe for knee scooter due to location of fracture. Per Mom "Dr. Hyacinth MeekerMiller said he would write for one". Message left for Dr. Hyacinth MeekerMiller at his office Mardella Layman(Lindsey CMA)- knee walker suspended. I received callback from Dr. Rondel BatonMiller's assistant Mardella LaymanLindsey stating that Dr. Hyacinth MeekerMiller wants patient to have the knee scooter. Knee scooter requested from Will with [facility]. Rx placed on patient's chart. I have notified patient's mom about PT's concern with knee scooter use/safety and she said "he will not be using it for several days/6 days anyway". She agreed to delivery. No further RNCM needs.Case closed.

## 2015-05-25 NOTE — Progress Notes (Signed)
Plan to discharge patient today. Doctor Hyacinth MeekerMiller called and order to change pepcid IV to pepcid 20mg  PO twice daily requested. Verbal order given by doctor Hyacinth MeekerMiller.

## 2015-05-25 NOTE — Discharge Instructions (Signed)
Do not bother dressing, change only if saturated. Adhere to your none weight bearing status.

## 2015-05-25 NOTE — Progress Notes (Signed)
Physical Therapy Treatment Patient Details Name: Theodore Garcia MRN: [id number] DOB: [birth year] Today's Date: 05/25/2015    History of Present Illness Theodore Garcia  is a 45 y.o. 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 Theodore Garcia with recent R tibial fracture on 05/16/15 after fall during ambulation to bathroom. Now s/p R LE ORIF on 3.29.17. Post sx, Theodore Garcia with allergic reaction to pain meds, sent to CCU for closer monitoring. New order received and cleared to work with Theodore Garcia, per RN.     Theodore Garcia Comments    Theodore Garcia is making good progress towards goals and is ready for discharge. Theodore Garcia has completed stair training demonstrating safe technique. Correct use of SW used. Theodore Garcia wanting to use knee scooter, however Theodore Garcia NWB and is not safe for knee scooter. Theodore Garcia also needs increased stability with AD. SW is safe AD for patient mobility. Still complains of increased pain at this time.  Follow Up Recommendations  Outpatient Theodore Garcia     Equipment Recommendations  None recommended by Theodore Garcia    Recommendations for Other Services       Precautions / Restrictions Precautions Precautions: Fall Restrictions Weight Bearing Restrictions: Yes RLE Weight Bearing: Non weight bearing    Mobility  Bed Mobility Overal bed mobility: Independent             General bed mobility comments: Theodore Garcia exits bed prior to cues from therapist. Theodore Garcia with independence and is impulsive  Transfers Overall transfer level: Modified independent Equipment used: Standard walker             General transfer comment: cues given for use of AD. No LOB noted.   Ambulation/Gait Ambulation/Gait assistance: Min guard Ambulation Distance (Feet): 220 Feet Assistive device: Standard walker Gait Pattern/deviations: Step-to pattern     General Gait Details: Impulsive gait, however improved speed noted with ambulation compared to previous date.  Theodore Garcia demonstrates upright posture during ambulation. Cues for safety given.   Stairs Stairs: Yes Stairs assistance: Supervision Stair Management: Two rails Number of Stairs: 4 General stair comments: Theodore Garcia able to hop up stairs with step to gait patten using B railings. Theodore Garcia demonstrates need for cues for sequencing, however is safe and does only 1 step at a time.  Wheelchair Mobility    Modified Rankin (Stroke Patients Only)       Balance                                    Cognition Arousal/Alertness: Awake/alert Behavior During Therapy: Impulsive Overall Cognitive Status: Within Functional Limits for tasks assessed                      Exercises Other Exercises Other Exercises: deferred at this time. Theodore Garcia has HEP from previous hospital admission    General Comments        Pertinent Vitals/Pain Pain Assessment: 0-10 Pain Score: 9  Pain Location: R LE Pain Descriptors / Indicators: Operative site guarding Pain Intervention(s): Limited activity within patient's tolerance;Premedicated before session    Home Living                      Prior Function            Theodore Garcia Goals (current goals can now be found in the care plan section) Acute Rehab Theodore Garcia Goals  Patient Stated Goal: to get stronger Theodore Garcia Goal Formulation: With patient Time For Goal Achievement: 06/07/15 Potential to Achieve Goals: Good Progress towards Theodore Garcia goals: Progressing toward goals    Frequency  BID    Theodore Garcia Plan Current plan remains appropriate    Co-evaluation             End of Session Equipment Utilized During Treatment: Gait belt Activity Tolerance: Patient tolerated treatment well Patient left: in bed     Time: 0901-0919 Theodore Garcia Time Calculation (min) (ACUTE ONLY): 18 min  Charges:  $Gait Training: 8-22 mins                    G Codes:      Jayveion Stalling 05/25/2015, 10:52 AM  Elizabeth PalauStephanie Eriel Dunckel, Theodore Garcia, DPT [phone]

## 2015-05-25 NOTE — Discharge Summary (Signed)
Physician Discharge Summary  Patient ID: Arty Lantzy MRN: [id number] DOB/AGE: 1970-03-10 45 y.o.  Admit date: 05/23/2015 Discharge date: 05/25/2015  Admission Diagnoses:  Discharge Diagnoses:  Active Problems:   Closed right tibial fracture   Discharged Condition: good  Hospital Course: Right tibial rodding 05/23/15 without complication.  Had allergic reaction probably to Cleocin requiring IV meds to control and overnight observation in ICU.  Did well after that and progressed with PT.  Ready for D/C today.  RTC 3-4 days.  Dressing changed today.    Consults: pulmonary/intensive care  Significant Diagnostic Studies: radiology: X-Ray: right tibia  Treatments: IV hydration, antibiotics: Ancef and cleocin, analgesia: Vicodin, anticoagulation: LMW heparin and surgery: right tibia rodding  Discharge Exam: Blood pressure 136/93, pulse 79, temperature 98 F (36.7 C), temperature source Oral, resp. rate 18, height  (1.956 m), weight 89.812 kg (198 lb), SpO2 95 %. Extremities: Homans sign is negative, no sign of DVT and wound benign  Disposition: 01-Home or Self Care  Discharge Instructions    Call MD for:  persistant nausea and vomiting    Complete by:  As directed      Call MD for:  redness, tenderness, or signs of infection (pain, swelling, redness, odor or green/yellow discharge around incision site)    Complete by:  As directed      Call MD for:  severe uncontrolled pain    Complete by:  As directed      Call MD for:  temperature >100.4    Complete by:  As directed      Diet - low sodium heart healthy    Complete by:  As directed      Discharge instructions    Complete by:  As directed   Ice and elevate right leg No weight right leg RTC 3-4 days     Increase activity slowly    Complete by:  As directed      Leave dressing on - Keep it clean, dry, and intact until clinic visit    Complete by:  As directed             Medication List    STOP taking these  medications        oxyCODONE 5 MG immediate release tablet  Commonly known as:  ROXICODONE      TAKE these medications        alendronate 10 MG tablet  Commonly known as:  FOSAMAX  Take 10 mg by mouth daily with lunch. Take with a full glass of water on an empty stomach.     ALPRAZolam 0.5 MG tablet  Commonly known as:  XANAX  Take 0.5 mg by mouth 3 (three) times daily as needed for anxiety or sleep.     amphetamine-dextroamphetamine 20 MG 24 hr capsule  Commonly known as:  ADDERALL XR  Take 20 mg by mouth daily at 12 noon.     amphetamine-dextroamphetamine 10 MG tablet  Commonly known as:  ADDERALL  Take 10 mg by mouth daily with lunch.     aspirin EC 325 MG tablet  Take 1 tablet (325 mg total) by mouth daily. Start taking daily but stop after your dose on Sunday march 26 th for your surgery on next wednesday     B-D ALLERGY SYRINGE 1CC/28G 28G X 1/2" 1 ML Misc  Generic drug:  Tuberculin-Allergy Syringes  1 Dose by Does not apply route once a week.     budesonide-formoterol 80-4.5 MCG/ACT inhaler  Commonly known  as:  SYMBICORT  Inhale 2 puffs into the lungs 2 (two) times daily.     EPIPEN 2-PAK 0.3 mg/0.3 mL Soaj injection  Generic drug:  EPINEPHrine  Inject 0.3 mg into the muscle once as needed (for severe allergic reaction). Reported on 05/23/2015     gabapentin 400 MG capsule  Commonly known as:  NEURONTIN  Take 1 capsule (400 mg total) by mouth 2 (two) times daily.     HYDROcodone-acetaminophen 7.5-325 MG tablet  Commonly known as:  NORCO  Take 1 tablet by mouth every 6 (six) hours as needed for moderate pain.     omega-3 acid ethyl esters 1 g capsule  Commonly known as:  LOVAZA  Take 1 g by mouth daily at 12 noon.     simvastatin 20 MG tablet  Commonly known as:  ZOCOR  Take 20 mg by mouth daily at 12 noon.           Follow-up Information    Follow up with Sanjuan Sawa E, MD. Schedule an appointment as soon as possible for a visit in 3 days.    Specialty:  Specialist   Why:  For wound re-check   Contact information:   [address] [place] [address] [phone]       Signed: Valinda HoarMILLER,Sheilah Rayos E 05/25/2015, 2:48 PM

## 2015-05-25 NOTE — Progress Notes (Addendum)
Patient discharged at [id number]. Nurse tech transported patient to car via wheelchair. Family provided transportation home. Patient A&Ox4 at discharge.

## 2015-05-25 NOTE — Progress Notes (Signed)
Patient refused Adderall scheduled for 1200, he stated that he would like to take it later. Patient will call when ready.

## 2015-05-25 NOTE — Progress Notes (Signed)
Subjective: 2 Days Post-Op Procedure(s) (LRB): INTRAMEDULLARY (IM) NAIL TIBIAL (Right)    Patient reports pain as mild. Ready to go home.  Will see in office in 3-4 days.  NWB right leg.   Objective:   VITALS:   Filed Vitals:   05/25/15 0356 05/25/15 0838  BP: 131/78 136/93  Pulse: 88 79  Temp: 97.9 F (36.6 C) 98 F (36.7 C)  Resp: 18 18    Neurologically intact Neurovascular intact Sensation intact distally  Dressing saturated. Changed and wound benign.   LABS  Recent Labs  05/23/15 1707  HGB 12.1*  HCT 35.2*  WBC 10.7*  PLT 315     Recent Labs  05/23/15 1707 05/24/15 0413  NA  --  135  K  --  4.1  BUN  --  14  CREATININE 0.86 0.91  GLUCOSE  --  173*    No results for input(s): LABPT, INR in the last 72 hours.   Assessment/Plan: 2 Days Post-Op Procedure(s) (LRB): INTRAMEDULLARY (IM) NAIL TIBIAL (Right)  D/C today RTC 3-4 days

## 2017-04-01 ENCOUNTER — Other Ambulatory Visit: Payer: Self-pay | Admitting: Specialist

## 2017-04-08 ENCOUNTER — Inpatient Hospital Stay: Admission: RE | Admit: 2017-04-08 | Payer: BLUE CROSS/BLUE SHIELD | Source: Ambulatory Visit

## 2017-04-09 ENCOUNTER — Inpatient Hospital Stay: Admission: RE | Admit: 2017-04-09 | Payer: BLUE CROSS/BLUE SHIELD | Source: Ambulatory Visit

## 2017-04-10 ENCOUNTER — Inpatient Hospital Stay
Admission: RE | Admit: 2017-04-10 | Discharge: 2017-04-10 | Disposition: A | Discharge status: Home / Self Care | Payer: BLUE CROSS/BLUE SHIELD | Source: Ambulatory Visit

## 2017-04-10 NOTE — Pre-Procedure Instructions (Addendum)
Third telephone contact made today to reach patient for his pre-admit interview.  Message left on answering machine. requested patient to return the call. [facility] notified [facility] That patient has cancelled this surgery due to financial reasons.

## 2017-04-15 ENCOUNTER — Encounter: Admission: RE | Payer: Self-pay | Source: Ambulatory Visit

## 2017-04-15 ENCOUNTER — Ambulatory Visit: Admission: RE | Admit: 2017-04-15 | Payer: BLUE CROSS/BLUE SHIELD | Source: Ambulatory Visit | Admitting: Specialist

## 2017-04-15 SURGERY — BURSECTOMY, ELBOW
Anesthesia: Choice | Laterality: Left

## 2018-02-15 ENCOUNTER — Other Ambulatory Visit: Payer: Self-pay | Admitting: Acute Care

## 2018-02-15 DIAGNOSIS — G44321 Chronic post-traumatic headache, intractable: Secondary | ICD-10-CM

## 2018-03-04 ENCOUNTER — Ambulatory Visit
Admission: RE | Admit: 2018-03-04 | Discharge: 2018-03-04 | Disposition: A | Discharge status: Home / Self Care | Payer: BLUE CROSS/BLUE SHIELD | Source: Ambulatory Visit | Attending: Acute Care | Admitting: Acute Care

## 2018-03-04 ENCOUNTER — Encounter (INDEPENDENT_AMBULATORY_CARE_PROVIDER_SITE_OTHER): Payer: Self-pay

## 2018-03-04 DIAGNOSIS — G44321 Chronic post-traumatic headache, intractable: Secondary | ICD-10-CM

## 2018-10-17 ENCOUNTER — Emergency Department
Admission: EM | Admit: 2018-10-17 | Discharge: 2018-10-17 | Disposition: A | Discharge status: Home / Self Care | Payer: BC Managed Care – PPO | Attending: Emergency Medicine | Admitting: Emergency Medicine

## 2018-10-17 ENCOUNTER — Emergency Department: Payer: BC Managed Care – PPO

## 2018-10-17 ENCOUNTER — Other Ambulatory Visit: Payer: Self-pay

## 2018-10-17 ENCOUNTER — Encounter: Payer: Self-pay | Admitting: Emergency Medicine

## 2018-10-17 DIAGNOSIS — Y999 Unspecified external cause status: Secondary | ICD-10-CM | POA: Diagnosis not present

## 2018-10-17 DIAGNOSIS — S91312A Laceration without foreign body, left foot, initial encounter: Secondary | ICD-10-CM

## 2018-10-17 DIAGNOSIS — I10 Essential (primary) hypertension: Secondary | ICD-10-CM | POA: Insufficient documentation

## 2018-10-17 DIAGNOSIS — W268XXA Contact with other sharp object(s), not elsewhere classified, initial encounter: Secondary | ICD-10-CM | POA: Insufficient documentation

## 2018-10-17 DIAGNOSIS — Y9389 Activity, other specified: Secondary | ICD-10-CM | POA: Diagnosis not present

## 2018-10-17 DIAGNOSIS — Y92410 Unspecified street and highway as the place of occurrence of the external cause: Secondary | ICD-10-CM | POA: Insufficient documentation

## 2018-10-17 MED ORDER — LIDOCAINE HCL 1 % IJ SOLN
5.0000 mL | Freq: Once | INTRAMUSCULAR | Status: AC
  Administered 2018-10-17: 22:00:00 5 mL
  Filled 2018-10-17: qty 10

## 2018-10-17 MED ORDER — SULFAMETHOXAZOLE-TRIMETHOPRIM 800-160 MG PO TABS: 1.0000 | ORAL_TABLET | Freq: Two times a day (BID) | ORAL | 0 refills | Status: AC

## 2018-10-17 NOTE — ED Notes (Signed)
Reviewed discharge instructions, follow-up care, and prescriptions with patient. Patient verbalized understanding of all information reviewed. Patient stable, with no distress noted at this time.    
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
## 2019-05-22 ENCOUNTER — Ambulatory Visit: Payer: BC Managed Care – PPO

## 2019-05-30 ENCOUNTER — Ambulatory Visit: Payer: Medicaid Other | Attending: Internal Medicine

## 2019-05-30 ENCOUNTER — Ambulatory Visit: Payer: Medicaid Other

## 2019-07-08 ENCOUNTER — Other Ambulatory Visit: Payer: Self-pay | Admitting: Orthopedic Surgery

## 2019-07-08 DIAGNOSIS — M5441 Lumbago with sciatica, right side: Secondary | ICD-10-CM

## 2019-07-08 DIAGNOSIS — G8929 Other chronic pain: Secondary | ICD-10-CM

## 2019-08-01 ENCOUNTER — Ambulatory Visit
Admission: RE | Admit: 2019-08-01 | Discharge: 2019-08-01 | Disposition: A | Discharge status: Home / Self Care | Payer: 59 | Source: Ambulatory Visit | Attending: Orthopedic Surgery | Admitting: Orthopedic Surgery

## 2019-08-01 ENCOUNTER — Other Ambulatory Visit: Payer: Self-pay

## 2019-08-01 DIAGNOSIS — G8929 Other chronic pain: Secondary | ICD-10-CM | POA: Insufficient documentation

## 2019-08-01 DIAGNOSIS — M5441 Lumbago with sciatica, right side: Secondary | ICD-10-CM | POA: Diagnosis not present

## 2019-08-25 ENCOUNTER — Inpatient Hospital Stay: Admit: 2019-08-25 | Payer: 59 | Admitting: Orthopedic Surgery

## 2019-08-25 SURGERY — ARTHROPLASTY, HIP, TOTAL, ANTERIOR APPROACH
Anesthesia: Choice | Site: Hip | Laterality: Right

## 2019-09-08 ENCOUNTER — Other Ambulatory Visit: Payer: Self-pay | Admitting: Orthopedic Surgery

## 2019-09-09 ENCOUNTER — Other Ambulatory Visit: Payer: Self-pay

## 2019-09-09 ENCOUNTER — Encounter
Admission: RE | Admit: 2019-09-09 | Discharge: 2019-09-09 | Disposition: A | Discharge status: Home / Self Care | Payer: 59 | Source: Ambulatory Visit | Attending: Orthopedic Surgery | Admitting: Orthopedic Surgery

## 2019-09-09 ENCOUNTER — Encounter: Payer: Self-pay | Admitting: Urgent Care

## 2019-09-09 DIAGNOSIS — I440 Atrioventricular block, first degree: Secondary | ICD-10-CM | POA: Diagnosis not present

## 2019-09-09 DIAGNOSIS — R001 Bradycardia, unspecified: Secondary | ICD-10-CM | POA: Diagnosis not present

## 2019-09-09 DIAGNOSIS — Z01818 Encounter for other preprocedural examination: Secondary | ICD-10-CM | POA: Insufficient documentation

## 2019-09-09 HISTORY — DX: Other seasonal allergic rhinitis: J30.2

## 2019-09-09 HISTORY — DX: Anxiety disorder, unspecified: F41.9

## 2019-09-09 HISTORY — DX: Unspecified asthma, uncomplicated: J45.909

## 2019-09-09 HISTORY — DX: Gastro-esophageal reflux disease without esophagitis: K21.9

## 2019-09-09 HISTORY — DX: Depression, unspecified: F32.A

## 2019-09-09 HISTORY — DX: Dermatitis, unspecified: L30.9

## 2019-09-09 LAB — CBC WITH DIFFERENTIAL/PLATELET
Abs Immature Granulocytes: 0.04 10*3/uL (ref 0.00–0.07)
Basophils Absolute: 0.1 10*3/uL (ref 0.0–0.1)
Basophils Relative: 1 %
Eosinophils Absolute: 0.4 10*3/uL (ref 0.0–0.5)
Eosinophils Relative: 4 %
HCT: 42.7 % (ref 39.0–52.0)
Hemoglobin: 14.2 g/dL (ref 13.0–17.0)
Immature Granulocytes: 0 %
Lymphocytes Relative: 23 %
Lymphs Abs: 2.2 10*3/uL (ref 0.7–4.0)
MCH: 31.3 pg (ref 26.0–34.0)
MCHC: 33.3 g/dL (ref 30.0–36.0)
MCV: 94.3 fL (ref 80.0–100.0)
Monocytes Absolute: 0.8 10*3/uL (ref 0.1–1.0)
Monocytes Relative: 8 %
Neutro Abs: 6.3 10*3/uL (ref 1.7–7.7)
Neutrophils Relative %: 64 %
Platelets: 222 10*3/uL (ref 150–400)
RBC: 4.53 MIL/uL (ref 4.22–5.81)
RDW: 13.1 % (ref 11.5–15.5)
WBC: 9.8 10*3/uL (ref 4.0–10.5)
nRBC: 0 % (ref 0.0–0.2)

## 2019-09-09 LAB — COMPREHENSIVE METABOLIC PANEL
ALT: 19 U/L (ref 0–44)
AST: 17 U/L (ref 15–41)
Albumin: 4.2 g/dL (ref 3.5–5.0)
Alkaline Phosphatase: 53 U/L (ref 38–126)
Anion gap: 5 (ref 5–15)
BUN: 14 mg/dL (ref 6–20)
CO2: 26 mmol/L (ref 22–32)
Calcium: 9.1 mg/dL (ref 8.9–10.3)
Chloride: 109 mmol/L (ref 98–111)
Creatinine, Ser: 0.86 mg/dL (ref 0.61–1.24)
GFR calc Af Amer: >60 mL/min (ref >60)
GFR calc non Af Amer: >60 mL/min (ref >60)
Glucose, Bld: 102 mg/dL — ABNORMAL HIGH (ref 70–99)
Potassium: 4 mmol/L (ref 3.5–5.1)
Sodium: 140 mmol/L (ref 135–145)
Total Bilirubin: 0.5 mg/dL (ref 0.3–1.2)
Total Protein: 7.1 g/dL (ref 6.5–8.1)

## 2019-09-09 LAB — URINALYSIS, ROUTINE W REFLEX MICROSCOPIC
Bilirubin Urine: NEGATIVE
Glucose, UA: NEGATIVE mg/dL
Hgb urine dipstick: NEGATIVE
Ketones, ur: NEGATIVE mg/dL
Leukocytes,Ua: NEGATIVE
Nitrite: NEGATIVE
Protein, ur: NEGATIVE mg/dL
Specific Gravity, Urine: 1.02 (ref 1.005–1.030)
pH: 5 (ref 5.0–8.0)

## 2019-09-09 LAB — SURGICAL PCR SCREEN
MRSA, PCR: NEGATIVE
Staphylococcus aureus: NEGATIVE

## 2019-09-09 LAB — TYPE AND SCREEN
ABO/RH(D): O POS
Antibody Screen: NEGATIVE

## 2019-09-09 NOTE — Patient Instructions (Addendum)
Your procedure is scheduled on: Thurs 7/22 Report to Day Surgery. To find out your arrival time please call [phone] between 1PM - 3PM on Wed. 7/21.  Remember: Instructions that are not followed completely may result in serious medical risk,  up to and including death, or upon the discretion of your surgeon and anesthesiologist your  surgery may need to be rescheduled.     _X__ 1. Do not eat food after midnight the night before your procedure.                 No gum chewing or hard candies. You may drink clear liquids up to 2 hours                 before you are scheduled to arrive for your surgery- DO not drink clear                 liquids within 2 hours of the start of your surgery.                 Clear Liquids include:  water, apple juice without pulp, clear Gatorade, G2 or                  Gatorade Zero (avoid Red/Purple/Blue), Black Coffee or Tea (Do not add                 anything to coffee or tea). __x___2.   Complete the carbohydrate drink provided to you, 2 hours before arrival.  __X__2.  On the morning of surgery brush your teeth with toothpaste and water, you                may rinse your mouth with mouthwash if you wish.  Do not swallow any toothpaste of mouthwash.     _X__ 3.  No Alcohol for 24 hours before or after surgery.   _X__ 4.  Do Not Smoke or use e-cigarettes For 24 Hours Prior to Your Surgery.                 Do not use any chewable tobacco products for at least 6 hours prior to                 Surgery.  _X__  5.  Do not use any recreational drugs (marijuana, cocaine, heroin, ecstacy, MDMA or other)                For at least one week prior to your surgery.  Combination of these drugs with anesthesia                May have life threatening results.  ____  6.  Bring all medications with you on the day of surgery if instructed.   _x___  7.  Notify your doctor if there is any change in your medical condition      (cold, fever,  infections).     Do not wear jewelry,  Do not wear lotions, powders, or perfumes. You may wear deodorant. Do not shave 48 hours prior to surgery. Men may shave face and neck. Do not bring valuables to the hospital.    [facility] is not responsible for any belongings or valuables.  Contacts, dentures or bridgework may not be worn into surgery. Leave your suitcase in the car. After surgery it may be brought to your room. For patients admitted to the hospital, discharge time is determined by your treatment team.   Patients discharged  the day of surgery will not be allowed to drive home.   Make arrangements for someone to be with you for the first 24 hours of your Same Day Discharge.    Please read over the following fact sheets that you were given:   Incentive spirometer  __x__ Take these medicines the morning of surgery with A SIP OF WATER:    1.ALPRAZolam (XANAX) 0.5 MG tablet if needed  2. fenofibrate (TRICOR) 48 MG tablet  3. gabapentin (NEURONTIN) 300 MG capsule  4.omeprazole (PRILOSEC) 20 MG capsule take dose the night before and the morning of surgery  5.rosuvastatin (CRESTOR) 40 MG tablet  6.azelastine (ASTELIN) 0.1 % nasal spray if needed  ____ Fleet Enema (as directed)   __x__ Use CHG Soap (or wipes) as directed  ____ Use Benzoyl Peroxide Gel as instructed  _x___ Use inhalers on the day of surgerybudesonide-formoterol (SYMBICORT) 80-4.5 MCG/ACT inhaler  ____ Stop metformin 2 days prior to surgery    ____ Take 1/2 of usual insulin dose the night before surgery. No insulin the morning          of surgery.   __x__ Stop aspirin today  __x__ Stop Anti-inflammatories today etodolac (LODINE) 500 MG tablet, ibuprofen and aleve   __x__ Stop supplements until after surgery.  Fish oil today  ____ Bring C-Pap to the hospital.

## 2019-09-12 NOTE — Progress Notes (Signed)
[facility] Perioperative Services  Pre-Admission/Anesthesia Testing Clinical Review  Date: 09/12/19  Patient Demographics:  Name: Theodore Garcia DOB:   03/16/1970 MRN:   [id number]  Planned Surgical Procedure(s):   Date: 09/09/19 Reason: TOTAL HIP ARTHROPLASTY ANTERIOR APPROACH (canceled)    NOTE: Available PAT nursing documentation and vital signs have been reviewed. Clinical nursing staff has updated patient's PMH/PSHx, current medication list, and drug allergies/intolerances to ensure comprehensive history available to assist in medical decision making as it pertains to the aforementioned surgical procedure and anticipated anesthetic course.   Clinical Discussion:  Theodore Garcia is a 49 y.o. male who is submitted for pre-surgical anesthesia review and clearance prior to him undergoing the above procedure. Patient is a Current Smoker. Pertinent PMH includes: atypical angina, HTN, HLD, GERD (on daily PPI), COPD, asthma, anxiety, depression, ADHD.  Patient is followed by cardiology Juliann Pares([place], MD). He was last seen in the cardiology clinic on 02/03/2019; notes reviewed. At that time, patient was seen for review of recent cardiac testing that was performed in the setting of atypical chest pain. Patient complained of vague intermittent chest pain and SOB. Echocardiogram and stress test demonstrated preserved EF and no evidence of ischemia (see below for results). Conservative medical management was recommended. Patient to follow up with cardiology in 1 year. Clearance for surgery was issued on 09/12/2019 with a low risk stratification by Dr. Juliann Paresallwood.   He denies previous intra-operative complications with anesthesia. This patient is on daily antiplatelet therapy. He has been instructed on recommendations for holding his daily low dose ASA for 7 days prior to his procedure. The patient has been instructed that his last dose of his anticoagulant will be on  09/09/2019.  Vitals with BMI 09/09/2019 10/17/2018 10/17/2018  Height 6\' 4"  - -  Weight 201 lbs - -  BMI 24.48 - -  Systolic 132 155 [id number]  Diastolic 86 105 97  Pulse 70 75 83    Providers/Specialists:   PROVIDER ROLE LAST OV  No att. providers found Orthopedics (Surgeon) 08/15/2019  Jerl MinaHedrick, James, MD Primary Care Provider 04/04/2019  Dorothyann Pengallwood, Dwayne, MD Cardiology 02/03/2019   Allergies:  Cefazolin, Clindamycin hcl, and Penicillins  Current Home Medications:   . ALPRAZolam (XANAX) 0.5 MG tablet  . amphetamine-dextroamphetamine (ADDERALL XR) 25 MG 24 hr capsule  . amphetamine-dextroamphetamine (ADDERALL) 10 MG tablet  . aspirin EC 81 MG tablet  . azelastine (ASTELIN) 0.1 % nasal spray  . budesonide-formoterol (SYMBICORT) 80-4.5 MCG/ACT inhaler  . Dupilumab (DUPIXENT) 300 MG/2ML SOPN  . EPINEPHrine (EPIPEN 2-PAK) 0.3 mg/0.3 mL IJ SOAJ injection  . etodolac (LODINE) 500 MG tablet  . fenofibrate (TRICOR) 48 MG tablet  . gabapentin (NEURONTIN) 300 MG capsule  . HYDROcodone-acetaminophen (NORCO/VICODIN) 5-325 MG tablet  . lisinopril (ZESTRIL) 20 MG tablet  . omeprazole (PRILOSEC) 20 MG capsule  . rosuvastatin (CRESTOR) 40 MG tablet  . Tuberculin-Allergy Syringes (B-D ALLERGY SYRINGE 1CC/28G) 28G X 1/2" 1 ML MISC   No current facility-administered medications for this encounter.   History:   Past Medical History:  Diagnosis Date  . ADHD (attention deficit hyperactivity disorder)   . Anxiety   . Asthma   . Depression   . Eczema   . GERD (gastroesophageal reflux disease)   . Hyperlipidemia   . Hypertension   . Osteoporosis   . Seasonal allergies    Past Surgical History:  Procedure Laterality Date  . BONE GRAFT HIP ILIAC CREST     left hip  . craniotomy  for closed fontanella  . TIBIA IM NAIL INSERTION Right 05/23/2015   Procedure: INTRAMEDULLARY (IM) NAIL TIBIAL;  Surgeon: Deeann Saint, MD;  Location: [facility] ORS;  Service: Orthopedics;  Laterality: Right;  .  TOTAL HIP ARTHROPLASTY     Left hip  . WISDOM TOOTH EXTRACTION    . WRIST FRACTURE SURGERY     Right wrist   Family History  Problem Relation Age of Onset  . CAD Mother        had CABG  . Prostate cancer Maternal Grandfather    Social History   Tobacco Use  . Smoking status: Current Every Day Smoker    Packs/day: 1.00    Types: Cigarettes  . Smokeless tobacco: Never Used  Vaping Use  . Vaping Use: Never used  Substance Use Topics  . Alcohol use: Yes    Alcohol/week: 0.0 standard drinks    Comment: liquor 2x/week- last drink 4 days ago  . Drug use: Not Currently    Types: Marijuana    Comment: inhales marijuana- last dip 4 days ago    Pertinent Clinical Results:  LABS: Labs reviewed: Acceptable for surgery.  Hospital Outpatient Visit on 09/09/2019  Component Date Value Ref Range Status  . MRSA, PCR 09/09/2019 NEGATIVE  NEGATIVE Final  . Staphylococcus aureus 09/09/2019 NEGATIVE  NEGATIVE Final   Comment: (NOTE) The Xpert SA Assay (FDA approved for NASAL specimens in patients 10 years of age and older), is one component of a comprehensive surveillance program. It is not intended to diagnose infection nor to guide or monitor treatment. Performed at [facility], [address] [place], [place] [address]   . WBC 09/09/2019 9.8  4.0 - 10.5 K/uL Final  . RBC 09/09/2019 4.53  4.22 - 5.81 MIL/uL Final  . Hemoglobin 09/09/2019 14.2  13.0 - 17.0 g/dL Final  . HCT 30/16/0109 42.7  39 - 52 % Final  . MCV 09/09/2019 94.3  80.0 - 100.0 fL Final  . MCH 09/09/2019 31.3  26.0 - 34.0 pg Final  . MCHC 09/09/2019 33.3  30.0 - 36.0 g/dL Final  . RDW 32/35/5732 13.1  11.5 - 15.5 % Final  . Platelets 09/09/2019 222  150 - 400 K/uL Final  . nRBC 09/09/2019 0.0  0.0 - 0.2 % Final  . Neutrophils Relative % 09/09/2019 64  % Final  . Neutro Abs 09/09/2019 6.3  1.7 - 7.7 K/uL Final  . Lymphocytes Relative 09/09/2019 23  % Final  . Lymphs Abs 09/09/2019 2.2  0.7 - 4.0 K/uL  Final  . Monocytes Relative 09/09/2019 8  % Final  . Monocytes Absolute 09/09/2019 0.8  0 - 1 K/uL Final  . Eosinophils Relative 09/09/2019 4  % Final  . Eosinophils Absolute 09/09/2019 0.4  0 - 0 K/uL Final  . Basophils Relative 09/09/2019 1  % Final  . Basophils Absolute 09/09/2019 0.1  0 - 0 K/uL Final  . Immature Granulocytes 09/09/2019 0  % Final  . Abs Immature Granulocytes 09/09/2019 0.04  0.00 - 0.07 K/uL Final   Performed at [facility], [address] [place], [place] [address]  . Sodium 09/09/2019 140  135 - 145 mmol/L Final  . Potassium 09/09/2019 4.0  3.5 - 5.1 mmol/L Final  . Chloride 09/09/2019 109  98 - 111 mmol/L Final  . CO2 09/09/2019 26  22 - 32 mmol/L Final  . Glucose, Bld 09/09/2019 102* 70 - 99 mg/dL Final   Glucose reference range applies only to samples taken  after fasting for at least 8 hours.  . BUN 09/09/2019 14  6 - 20 mg/dL Final  . Creatinine, Ser 09/09/2019 0.86  0.61 - 1.24 mg/dL Final  . Calcium 48/25/0037 9.1  8.9 - 10.3 mg/dL Final  . Total Protein 09/09/2019 7.1  6.5 - 8.1 g/dL Final  . Albumin 04/88/8916 4.2  3.5 - 5.0 g/dL Final  . AST 94/50/3888 17  15 - 41 U/L Final  . ALT 09/09/2019 19  0 - 44 U/L Final  . Alkaline Phosphatase 09/09/2019 53  38 - 126 U/L Final  . Total Bilirubin 09/09/2019 0.5  0.3 - 1.2 mg/dL Final  . GFR calc non Af Amer 09/09/2019 >60  >60 mL/min Final  . GFR calc Af Amer 09/09/2019 >60  >60 mL/min Final  . Anion gap 09/09/2019 5  5 - 15 Final   Performed at [facility], [address], [place], [place] [address]  . ABO/RH(D) 09/09/2019 O POS   Final  . Antibody Screen 09/09/2019 NEG   Final  . Sample Expiration 09/09/2019 09/23/2019,2359   Final  . Extend sample reason 09/09/2019    Final                   Value:NO TRANSFUSIONS OR PREGNANCY IN THE PAST 3 MONTHS Performed at [facility], [address] [place], [place] [address]   . Color, Urine 09/09/2019 YELLOW* YELLOW Final  .  APPearance 09/09/2019 CLEAR* CLEAR Final  . Specific Gravity, Urine 09/09/2019 1.020  1.005 - 1.030 Final  . pH 09/09/2019 5.0  5.0 - 8.0 Final  . Glucose, UA 09/09/2019 NEGATIVE  NEGATIVE mg/dL Final  . Hgb urine dipstick 09/09/2019 NEGATIVE  NEGATIVE Final  . Bilirubin Urine 09/09/2019 NEGATIVE  NEGATIVE Final  . Ketones, ur 09/09/2019 NEGATIVE  NEGATIVE mg/dL Final  . Protein, ur 15/06/6977 NEGATIVE  NEGATIVE mg/dL Final  . Nitrite 48/02/6551 NEGATIVE  NEGATIVE Final  . Glori Luis 09/09/2019 NEGATIVE  NEGATIVE Final   Performed at [facility], [address] [place], [place] [address]    EKG: Date: 09/12/2019 Time ECG obtained: 1202 PM Rate: 53 bpm Rhythm: SB with 1st degree AV block Axis (leads I and aVF): Normal Intervals: PR 228 ms. QTc 405 ms. ST segment and T wave changes: No evidence of ST segment elevation or depression Comparison: PR interval has increased from 182 ms when compared to 01/12/2019 tracing.    IMAGING / PROCEDURES: MR LUMBAR SPINE W/O CONTRAST done on 08/01/2019 1. Compared to a 2015 MRI the most pronounced new neural impingement is at the left L3 neural foramen, related to disc degeneration with annular fissure. 2. No convincing new or increased right side neural impingement. However, L4-L5 facet arthropathy does appear progressed and is somewhat greater on the right. And facet disease can be a source of low back pain which sometimes radiates. 3. Resolved left paracentral disc herniation at L5-S1. No lumbar spinal or lateral recess stenosis.  NM MYOCADRIAL PERFUSION SCAN done on 01/31/2019 1. Normal myocardial perfusion scan no evidence of stress-induced myocardial ischemia  2. LVEF 58%  3. Conclusion: negative scan  ECHOCARDIOGRAM done on 01/31/2020 1. LVEF 55% 2. Normal LV systolic function with mild LVH 3. Normal RV systolic function 4. Trivial MR and TR 5. No Valvular stenosis  Impression and Plan:  Theodore Garcia has been  referred for pre-anesthesia review and clearance prior him undergoing the planned anesthetic and procedural courses. Available labs, pertinent testing, and imaging results were personally  reviewed by me. This patient has been appropriately cleared by cardiology.   Based on clinical review performed today (09/12/19), barring and significant acute changes in the patient's overall condition, it is anticipated that he will be able to proceed with the planned surgical intervention. Any acute changes in clinical condition may necessitate his procedure being postponed and/or cancelled. Pre-surgical instructions were reviewed with the patient during his PAT appointment and questions were fielded by PAT clinical staff.  Quentin Mulling, MSN, APRN, FNP-C, CEN [facility]  Peri-operative Services Nurse Practitioner Phone: [phone] 09/12/19 4:41 PM  NOTE: This note has been prepared using Dragon dictation software. Despite my best ability to proofread, there is always the potential that unintentional transcriptional errors may still occur from this process.

## 2019-09-13 ENCOUNTER — Other Ambulatory Visit
Admission: RE | Admit: 2019-09-13 | Discharge: 2019-09-13 | Disposition: A | Discharge status: Home / Self Care | Payer: 59 | Source: Ambulatory Visit | Attending: Orthopedic Surgery | Admitting: Orthopedic Surgery

## 2019-09-15 ENCOUNTER — Inpatient Hospital Stay: Admission: RE | Admit: 2019-09-15 | Payer: 59 | Source: Home / Self Care | Admitting: Orthopedic Surgery

## 2019-09-15 ENCOUNTER — Encounter: Admission: RE | Payer: Self-pay | Source: Home / Self Care

## 2019-09-15 SURGERY — ARTHROPLASTY, HIP, TOTAL, ANTERIOR APPROACH
Anesthesia: Choice | Site: Hip | Laterality: Right

## 2019-11-02 ENCOUNTER — Other Ambulatory Visit: Payer: Self-pay | Admitting: Orthopedic Surgery

## 2019-11-04 ENCOUNTER — Other Ambulatory Visit: Payer: Self-pay

## 2019-11-04 ENCOUNTER — Encounter
Admission: RE | Admit: 2019-11-04 | Discharge: 2019-11-04 | Disposition: A | Discharge status: Home / Self Care | Payer: 59 | Source: Ambulatory Visit | Attending: Orthopedic Surgery | Admitting: Orthopedic Surgery

## 2019-11-04 DIAGNOSIS — Z01818 Encounter for other preprocedural examination: Secondary | ICD-10-CM | POA: Insufficient documentation

## 2019-11-04 LAB — URINALYSIS, ROUTINE W REFLEX MICROSCOPIC
Bilirubin Urine: NEGATIVE
Glucose, UA: NEGATIVE mg/dL
Hgb urine dipstick: NEGATIVE
Ketones, ur: NEGATIVE mg/dL
Leukocytes,Ua: NEGATIVE
Nitrite: NEGATIVE
Protein, ur: NEGATIVE mg/dL
Specific Gravity, Urine: 1.018 (ref 1.005–1.030)
pH: 5 (ref 5.0–8.0)

## 2019-11-04 LAB — CBC WITH DIFFERENTIAL/PLATELET
Abs Immature Granulocytes: 0.03 10*3/uL (ref 0.00–0.07)
Basophils Absolute: 0.1 10*3/uL (ref 0.0–0.1)
Basophils Relative: 1 %
Eosinophils Absolute: 0.4 10*3/uL (ref 0.0–0.5)
Eosinophils Relative: 4 %
HCT: 41.9 % (ref 39.0–52.0)
Hemoglobin: 14.6 g/dL (ref 13.0–17.0)
Immature Granulocytes: 0 %
Lymphocytes Relative: 31 %
Lymphs Abs: 2.7 10*3/uL (ref 0.7–4.0)
MCH: 31.6 pg (ref 26.0–34.0)
MCHC: 34.8 g/dL (ref 30.0–36.0)
MCV: 90.7 fL (ref 80.0–100.0)
Monocytes Absolute: 0.6 10*3/uL (ref 0.1–1.0)
Monocytes Relative: 7 %
Neutro Abs: 5 10*3/uL (ref 1.7–7.7)
Neutrophils Relative %: 57 %
Platelets: 231 10*3/uL (ref 150–400)
RBC: 4.62 MIL/uL (ref 4.22–5.81)
RDW: 12.7 % (ref 11.5–15.5)
WBC: 8.8 10*3/uL (ref 4.0–10.5)
nRBC: 0 % (ref 0.0–0.2)

## 2019-11-04 LAB — COMPREHENSIVE METABOLIC PANEL
ALT: 25 U/L (ref 0–44)
AST: 19 U/L (ref 15–41)
Albumin: 4.2 g/dL (ref 3.5–5.0)
Alkaline Phosphatase: 59 U/L (ref 38–126)
Anion gap: 7 (ref 5–15)
BUN: 13 mg/dL (ref 6–20)
CO2: 26 mmol/L (ref 22–32)
Calcium: 8.8 mg/dL — ABNORMAL LOW (ref 8.9–10.3)
Chloride: 106 mmol/L (ref 98–111)
Creatinine, Ser: 0.94 mg/dL (ref 0.61–1.24)
GFR calc Af Amer: >60 mL/min (ref >60)
GFR calc non Af Amer: >60 mL/min (ref >60)
Glucose, Bld: 118 mg/dL — ABNORMAL HIGH (ref 70–99)
Potassium: 3.7 mmol/L (ref 3.5–5.1)
Sodium: 139 mmol/L (ref 135–145)
Total Bilirubin: 0.6 mg/dL (ref 0.3–1.2)
Total Protein: 7.1 g/dL (ref 6.5–8.1)

## 2019-11-04 LAB — SURGICAL PCR SCREEN
MRSA, PCR: NEGATIVE
Staphylococcus aureus: NEGATIVE

## 2019-11-04 LAB — TYPE AND SCREEN
ABO/RH(D): O POS
Antibody Screen: NEGATIVE

## 2019-11-04 NOTE — Patient Instructions (Signed)
Your procedure is scheduled on: Tuesday November 08, 2019. Report to Day Surgery inside [facility] 2nd floor. To find out your arrival time please call [phone] between 1PM - 3PM on Monday November 07, 2019.  Remember: Instructions that are not followed completely may result in serious medical risk,  up to and including death, or upon the discretion of your surgeon and anesthesiologist your  surgery may need to be rescheduled.     _X__ 1. Do not eat food after midnight the night before your procedure.                 No chewing gum or hard candies. You may drink clear liquids up to 2 hours                 before you are scheduled to arrive for your surgery- DO not drink clear                 liquids within 2 hours of the start of your surgery.                 Clear Liquids include:  water, apple juice without pulp, clear Gatorade, G2 or                  Gatorade Zero (avoid Red/Purple/Blue), Black Coffee or Tea (Do not add                 anything to coffee or tea).  ___X__2.   Complete the "Ensure Clear Pre-surgery Clear Carbohydrate Drink" provided to you, 2 hours before arrival. **If you are diabetic you will be provided with an alternative drink, Gatorade Zero or G2.  __X__3.  On the morning of surgery brush your teeth with toothpaste and water, you                may rinse your mouth with mouthwash if you wish.  Do not swallow any toothpaste of mouthwash.     _X__ 4.  No Alcohol for 24 hours before or after surgery.   _X__ 5.  Do Not Smoke or use e-cigarettes For 24 Hours Prior to Your Surgery.                 Do not use any chewable tobacco products for at least 6 hours prior to                 Surgery.  _X__  6.  Do not use any recreational drugs (marijuana, cocaine, heroin, ecstasy, MDMA or other)                For at least one week prior to your surgery.  Combination of these drugs with anesthesia                May have life threatening  results.  ___X_  7.  Notify your doctor if there is any change in your medical condition      (cold, fever, infections).     Do not wear jewelry, make-up, hairpins, clips or nail polish. Do not wear lotions, powders, or perfumes. You may wear deodorant. Do not shave 48 hours prior to surgery. Men may shave face and neck. Do not bring valuables to the hospital.    [facility] is not responsible for any belongings or valuables.  Contacts, dentures or bridgework may not be worn into surgery. Leave your suitcase in the car. After surgery it may be brought to your room. For  patients admitted to the hospital, discharge time is determined by your treatment team.   Patients discharged the day of surgery will not be allowed to drive home.   Make arrangements for someone to be with you for the first 24 hours of your Same Day Discharge.    Please read over the following fact sheets that you were given:  Joint Replacement book    __X__ Take these medicines the morning of surgery with A SIP OF WATER:    1. amphetamine-dextroamphetamine (ADDERALL XR) 25  2. omeprazole (PRILOSEC) 20 MG  3.   __X__ Use CHG Soap (or wipes) as directed   __X__ Use inhalers on the day of surgery  budesonide-formoterol (SYMBICORT) 80-4.5 MCG/ACT inhaler  __X__ Stop aspirin as instructed by provider.  __x__ Stop Anti-inflammatories such as etodolac (LODINE), Ibuprofen, Aleve, Advil, aspirin and or BC powders.    __x__ Stop supplements until after surgery.    __x__ Do not start any herbal supplements before your surgery.    If you have any questions regarding your pre-procedure instructions,  Please call Pre-admit Testing at [phone]

## 2019-11-07 ENCOUNTER — Other Ambulatory Visit: Payer: 59

## 2019-11-07 ENCOUNTER — Other Ambulatory Visit: Payer: Self-pay

## 2019-11-07 ENCOUNTER — Other Ambulatory Visit
Admission: RE | Admit: 2019-11-07 | Discharge: 2019-11-07 | Disposition: A | Discharge status: Home / Self Care | Payer: 59 | Source: Ambulatory Visit | Attending: Orthopedic Surgery | Admitting: Orthopedic Surgery

## 2019-11-07 DIAGNOSIS — Z01812 Encounter for preprocedural laboratory examination: Secondary | ICD-10-CM | POA: Insufficient documentation

## 2019-11-07 DIAGNOSIS — Z20822 Contact with and (suspected) exposure to covid-19: Secondary | ICD-10-CM | POA: Insufficient documentation

## 2019-11-08 ENCOUNTER — Ambulatory Visit: Payer: 59

## 2019-11-08 ENCOUNTER — Ambulatory Visit
Admission: RE | Admit: 2019-11-08 | Discharge: 2019-11-08 | Disposition: A | Discharge status: Home / Self Care | Payer: 59 | Attending: Orthopedic Surgery | Admitting: Orthopedic Surgery

## 2019-11-08 ENCOUNTER — Other Ambulatory Visit: Payer: Self-pay

## 2019-11-08 ENCOUNTER — Ambulatory Visit: Payer: 59 | Admitting: Anesthesiology

## 2019-11-08 ENCOUNTER — Encounter: Payer: Self-pay | Admitting: Orthopedic Surgery

## 2019-11-08 ENCOUNTER — Encounter
Admission: RE | Disposition: A | Discharge status: Home / Self Care | Payer: Self-pay | Source: Home / Self Care | Attending: Orthopedic Surgery

## 2019-11-08 DIAGNOSIS — M879 Osteonecrosis, unspecified: Secondary | ICD-10-CM | POA: Insufficient documentation

## 2019-11-08 DIAGNOSIS — L309 Dermatitis, unspecified: Secondary | ICD-10-CM | POA: Insufficient documentation

## 2019-11-08 DIAGNOSIS — F419 Anxiety disorder, unspecified: Secondary | ICD-10-CM | POA: Diagnosis not present

## 2019-11-08 DIAGNOSIS — Z79899 Other long term (current) drug therapy: Secondary | ICD-10-CM | POA: Diagnosis not present

## 2019-11-08 DIAGNOSIS — J45909 Unspecified asthma, uncomplicated: Secondary | ICD-10-CM | POA: Insufficient documentation

## 2019-11-08 DIAGNOSIS — F909 Attention-deficit hyperactivity disorder, unspecified type: Secondary | ICD-10-CM | POA: Insufficient documentation

## 2019-11-08 DIAGNOSIS — I10 Essential (primary) hypertension: Secondary | ICD-10-CM | POA: Diagnosis not present

## 2019-11-08 DIAGNOSIS — E785 Hyperlipidemia, unspecified: Secondary | ICD-10-CM | POA: Insufficient documentation

## 2019-11-08 DIAGNOSIS — Z419 Encounter for procedure for purposes other than remedying health state, unspecified: Secondary | ICD-10-CM

## 2019-11-08 DIAGNOSIS — Z96642 Presence of left artificial hip joint: Secondary | ICD-10-CM | POA: Insufficient documentation

## 2019-11-08 DIAGNOSIS — K219 Gastro-esophageal reflux disease without esophagitis: Secondary | ICD-10-CM | POA: Insufficient documentation

## 2019-11-08 DIAGNOSIS — F329 Major depressive disorder, single episode, unspecified: Secondary | ICD-10-CM | POA: Diagnosis not present

## 2019-11-08 DIAGNOSIS — Z7982 Long term (current) use of aspirin: Secondary | ICD-10-CM | POA: Insufficient documentation

## 2019-11-08 DIAGNOSIS — Z87891 Personal history of nicotine dependence: Secondary | ICD-10-CM | POA: Diagnosis not present

## 2019-11-08 DIAGNOSIS — G8918 Other acute postprocedural pain: Secondary | ICD-10-CM

## 2019-11-08 DIAGNOSIS — Z7951 Long term (current) use of inhaled steroids: Secondary | ICD-10-CM | POA: Insufficient documentation

## 2019-11-08 HISTORY — PX: TOTAL HIP ARTHROPLASTY: SHX124

## 2019-11-08 LAB — URINE DRUG SCREEN, QUALITATIVE (ARMC ONLY)
Amphetamines, Ur Screen: NOT DETECTED
Barbiturates, Ur Screen: NOT DETECTED
Benzodiazepine, Ur Scrn: POSITIVE — AB
Cannabinoid 50 Ng, Ur ~~LOC~~: POSITIVE — AB
Cocaine Metabolite,Ur ~~LOC~~: NOT DETECTED
MDMA (Ecstasy)Ur Screen: NOT DETECTED
Methadone Scn, Ur: NOT DETECTED
Opiate, Ur Screen: POSITIVE — AB
Phencyclidine (PCP) Ur S: NOT DETECTED
Tricyclic, Ur Screen: NOT DETECTED

## 2019-11-08 LAB — SARS CORONAVIRUS 2 (TAT 6-24 HRS): SARS Coronavirus 2: NEGATIVE

## 2019-11-08 SURGERY — ARTHROPLASTY, HIP, TOTAL, ANTERIOR APPROACH
Anesthesia: General | Site: Hip | Laterality: Right

## 2019-11-08 MED ORDER — METHOCARBAMOL 1000 MG/10ML IJ SOLN
500.0000 mg | Freq: Four times a day (QID) | INTRAVENOUS | Status: DC | PRN
  Administered 2019-11-08: 500 mg via INTRAVENOUS
  Filled 2019-11-08: qty 5

## 2019-11-08 MED ORDER — ONDANSETRON HCL 4 MG/2ML IJ SOLN
4.0000 mg | Freq: Once | INTRAMUSCULAR | Status: AC | PRN
  Administered 2019-11-08: 4 mg via INTRAVENOUS

## 2019-11-08 MED ORDER — HYDROMORPHONE HCL 1 MG/ML IJ SOLN
INTRAMUSCULAR | Status: DC
Start: 2019-11-08 — End: 2019-11-08
  Filled 2019-11-08: qty 1

## 2019-11-08 MED ORDER — TRAMADOL HCL 50 MG PO TABS
ORAL_TABLET | ORAL | Status: AC
  Administered 2019-11-08: 50 mg via ORAL
  Filled 2019-11-08: qty 1

## 2019-11-08 MED ORDER — OXYCODONE HCL 5 MG PO TABS: 5.0000 mg | ORAL_TABLET | ORAL | 0 refills | Status: AC | PRN

## 2019-11-08 MED ORDER — OXYCODONE HCL 5 MG PO TABS: 10.0000 mg | ORAL_TABLET | ORAL | Status: DC | PRN

## 2019-11-08 MED ORDER — LACTATED RINGERS IV BOLUS
250.0000 mL | Freq: Once | INTRAVENOUS | Status: AC
  Administered 2019-11-08: 250 mL via INTRAVENOUS

## 2019-11-08 MED ORDER — ACETAMINOPHEN 325 MG PO TABS: 325.0000 mg | ORAL_TABLET | Freq: Four times a day (QID) | ORAL | Status: DC | PRN

## 2019-11-08 MED ORDER — PROPOFOL 500 MG/50ML IV EMUL
INTRAVENOUS | Status: AC
  Filled 2019-11-08: qty 50

## 2019-11-08 MED ORDER — MIDAZOLAM HCL 2 MG/2ML IJ SOLN
INTRAMUSCULAR | Status: AC
  Filled 2019-11-08: qty 4

## 2019-11-08 MED ORDER — PROPOFOL 10 MG/ML IV BOLUS
INTRAVENOUS | Status: DC | PRN
  Administered 2019-11-08: 200 mg via INTRAVENOUS

## 2019-11-08 MED ORDER — VANCOMYCIN HCL IN DEXTROSE 1-5 GM/200ML-% IV SOLN
1000.0000 mg | Freq: Two times a day (BID) | INTRAVENOUS | Status: AC
  Administered 2019-11-08: 1000 mg via INTRAVENOUS

## 2019-11-08 MED ORDER — LACTATED RINGERS IV BOLUS
500.0000 mL | Freq: Once | INTRAVENOUS | Status: AC
  Administered 2019-11-08: 500 mL via INTRAVENOUS

## 2019-11-08 MED ORDER — NEOMYCIN-POLYMYXIN B GU 40-200000 IR SOLN
Status: DC | PRN
  Administered 2019-11-08: 4 mL

## 2019-11-08 MED ORDER — MIDAZOLAM HCL 5 MG/5ML IJ SOLN
INTRAMUSCULAR | Status: DC | PRN
  Administered 2019-11-08: 4 mg via INTRAVENOUS

## 2019-11-08 MED ORDER — HYDROMORPHONE HCL 1 MG/ML IJ SOLN
0.5000 mg | INTRAMUSCULAR | Status: DC | PRN
  Administered 2019-11-08 (×2): 0.5 mg via INTRAVENOUS

## 2019-11-08 MED ORDER — CHLORHEXIDINE GLUCONATE 0.12 % MT SOLN: 15.0000 mL | Freq: Once | OROMUCOSAL | Status: AC

## 2019-11-08 MED ORDER — ACETAMINOPHEN 500 MG PO TABS
ORAL_TABLET | ORAL | Status: AC
  Administered 2019-11-08: 1000 mg via ORAL
  Filled 2019-11-08: qty 2

## 2019-11-08 MED ORDER — METHOCARBAMOL 500 MG PO TABS: 500.0000 mg | ORAL_TABLET | Freq: Four times a day (QID) | ORAL | Status: DC | PRN

## 2019-11-08 MED ORDER — HYDROMORPHONE HCL 1 MG/ML IJ SOLN: 0.5000 mg | INTRAMUSCULAR | Status: DC | PRN

## 2019-11-08 MED ORDER — BUPIVACAINE HCL (PF) 0.5 % IJ SOLN
INTRAMUSCULAR | Status: AC
  Filled 2019-11-08: qty 10

## 2019-11-08 MED ORDER — DEXMEDETOMIDINE (PRECEDEX) IN NS 20 MCG/5ML (4 MCG/ML) IV SYRINGE
PREFILLED_SYRINGE | INTRAVENOUS | Status: DC | PRN
  Administered 2019-11-08: 8 ug via INTRAVENOUS
  Administered 2019-11-08: 12 ug via INTRAVENOUS

## 2019-11-08 MED ORDER — LIDOCAINE HCL (CARDIAC) PF 100 MG/5ML IV SOSY
PREFILLED_SYRINGE | INTRAVENOUS | Status: DC | PRN
  Administered 2019-11-08: 100 mg via INTRAVENOUS

## 2019-11-08 MED ORDER — SODIUM CHLORIDE 0.9 % IV SOLN
INTRAVENOUS | Status: DC | PRN
  Administered 2019-11-08: 60 mL

## 2019-11-08 MED ORDER — ACETAMINOPHEN 500 MG PO TABS: 1000.0000 mg | ORAL_TABLET | Freq: Four times a day (QID) | ORAL | Status: DC

## 2019-11-08 MED ORDER — TRAMADOL HCL 50 MG PO TABS: 50.0000 mg | ORAL_TABLET | Freq: Four times a day (QID) | ORAL | Status: DC

## 2019-11-08 MED ORDER — BUPIVACAINE-EPINEPHRINE (PF) 0.25% -1:200000 IJ SOLN
INTRAMUSCULAR | Status: AC
  Filled 2019-11-08: qty 30

## 2019-11-08 MED ORDER — HYDROMORPHONE HCL 1 MG/ML IJ SOLN
0.5000 mg | INTRAMUSCULAR | Status: AC | PRN
  Administered 2019-11-08 (×2): 0.5 mg via INTRAVENOUS

## 2019-11-08 MED ORDER — LACTATED RINGERS IV SOLN: INTRAVENOUS | Status: DC

## 2019-11-08 MED ORDER — ACETAMINOPHEN 10 MG/ML IV SOLN
INTRAVENOUS | Status: DC | PRN
  Administered 2019-11-08: 1000 mg via INTRAVENOUS

## 2019-11-08 MED ORDER — MIDAZOLAM HCL 2 MG/2ML IJ SOLN
INTRAMUSCULAR | Status: AC
  Filled 2019-11-08: qty 2

## 2019-11-08 MED ORDER — BUPIVACAINE LIPOSOME 1.3 % IJ SUSP
INTRAMUSCULAR | Status: AC
  Filled 2019-11-08: qty 20

## 2019-11-08 MED ORDER — ENOXAPARIN SODIUM 40 MG/0.4ML ~~LOC~~ SOLN: 40.0000 mg | SUBCUTANEOUS | Status: DC

## 2019-11-08 MED ORDER — DIPHENHYDRAMINE HCL 50 MG/ML IJ SOLN
INTRAMUSCULAR | Status: DC | PRN
  Administered 2019-11-08: 50 mg via INTRAVENOUS

## 2019-11-08 MED ORDER — KETOROLAC TROMETHAMINE 30 MG/ML IJ SOLN
INTRAMUSCULAR | Status: AC
  Administered 2019-11-08: 30 mg via INTRAVENOUS
  Filled 2019-11-08: qty 1

## 2019-11-08 MED ORDER — MIDAZOLAM HCL 2 MG/2ML IJ SOLN
2.0000 mg | Freq: Once | INTRAMUSCULAR | Status: AC
  Administered 2019-11-08: 2 mg via INTRAVENOUS

## 2019-11-08 MED ORDER — LIDOCAINE HCL (PF) 2 % IJ SOLN
INTRAMUSCULAR | Status: AC
  Filled 2019-11-08: qty 5

## 2019-11-08 MED ORDER — ORAL CARE MOUTH RINSE: 15.0000 mL | Freq: Once | OROMUCOSAL | Status: AC

## 2019-11-08 MED ORDER — VANCOMYCIN HCL IN DEXTROSE 1-5 GM/200ML-% IV SOLN
INTRAVENOUS | Status: AC
  Administered 2019-11-08: 1000 mg via INTRAVENOUS
  Filled 2019-11-08: qty 200

## 2019-11-08 MED ORDER — SUGAMMADEX SODIUM 500 MG/5ML IV SOLN
INTRAVENOUS | Status: DC | PRN
  Administered 2019-11-08: 200 mg via INTRAVENOUS

## 2019-11-08 MED ORDER — OXYCODONE HCL 5 MG PO TABS: 5.0000 mg | ORAL_TABLET | ORAL | Status: DC | PRN

## 2019-11-08 MED ORDER — DEXMEDETOMIDINE (PRECEDEX) IN NS 20 MCG/5ML (4 MCG/ML) IV SYRINGE
PREFILLED_SYRINGE | INTRAVENOUS | Status: AC
  Filled 2019-11-08: qty 5

## 2019-11-08 MED ORDER — HYDROMORPHONE HCL 1 MG/ML IJ SOLN
INTRAMUSCULAR | Status: AC
  Administered 2019-11-08: 0.5 mg via INTRAVENOUS
  Filled 2019-11-08: qty 1

## 2019-11-08 MED ORDER — FENTANYL CITRATE (PF) 100 MCG/2ML IJ SOLN
INTRAMUSCULAR | Status: DC | PRN
Start: 2019-11-08 — End: 2019-11-08
  Administered 2019-11-08 (×2): 100 ug via INTRAVENOUS
  Administered 2019-11-08: 50 ug via INTRAVENOUS

## 2019-11-08 MED ORDER — SODIUM CHLORIDE 0.9 % IV SOLN: INTRAVENOUS | Status: DC

## 2019-11-08 MED ORDER — CHLORHEXIDINE GLUCONATE 0.12 % MT SOLN
OROMUCOSAL | Status: AC
  Administered 2019-11-08: 15 mL via OROMUCOSAL
  Filled 2019-11-08: qty 15

## 2019-11-08 MED ORDER — KETOROLAC TROMETHAMINE 15 MG/ML IJ SOLN: 15.0000 mg | Freq: Four times a day (QID) | INTRAMUSCULAR | Status: DC | PRN

## 2019-11-08 MED ORDER — PHENOL 1.4 % MT LIQD: 1.0000 | OROMUCOSAL | Status: DC | PRN

## 2019-11-08 MED ORDER — METOCLOPRAMIDE HCL 5 MG/ML IJ SOLN: 5.0000 mg | Freq: Three times a day (TID) | INTRAMUSCULAR | Status: DC | PRN

## 2019-11-08 MED ORDER — DOCUSATE SODIUM 100 MG PO CAPS: 100.0000 mg | ORAL_CAPSULE | Freq: Two times a day (BID) | ORAL | Status: DC

## 2019-11-08 MED ORDER — ENOXAPARIN SODIUM 40 MG/0.4ML ~~LOC~~ SOLN: 40.0000 mg | SUBCUTANEOUS | 0 refills | Status: DC

## 2019-11-08 MED ORDER — VANCOMYCIN HCL IN DEXTROSE 1-5 GM/200ML-% IV SOLN: 1000.0000 mg | INTRAVENOUS | Status: AC

## 2019-11-08 MED ORDER — BUPIVACAINE-EPINEPHRINE 0.25% -1:200000 IJ SOLN
INTRAMUSCULAR | Status: DC | PRN
  Administered 2019-11-08: 30 mL

## 2019-11-08 MED ORDER — ONDANSETRON HCL 4 MG/2ML IJ SOLN: 4.0000 mg | Freq: Four times a day (QID) | INTRAMUSCULAR | Status: DC | PRN

## 2019-11-08 MED ORDER — VANCOMYCIN HCL IN DEXTROSE 1-5 GM/200ML-% IV SOLN
INTRAVENOUS | Status: AC
  Filled 2019-11-08: qty 200

## 2019-11-08 MED ORDER — FENTANYL CITRATE (PF) 100 MCG/2ML IJ SOLN
25.0000 ug | INTRAMUSCULAR | Status: DC | PRN
  Administered 2019-11-08 (×2): 50 ug via INTRAVENOUS

## 2019-11-08 MED ORDER — FENTANYL CITRATE (PF) 250 MCG/5ML IJ SOLN
INTRAMUSCULAR | Status: AC
  Filled 2019-11-08: qty 5

## 2019-11-08 MED ORDER — FENTANYL CITRATE (PF) 100 MCG/2ML IJ SOLN: 50.0000 ug | INTRAMUSCULAR | Status: DC | PRN

## 2019-11-08 MED ORDER — MENTHOL 3 MG MT LOZG: 1.0000 | LOZENGE | OROMUCOSAL | Status: DC | PRN

## 2019-11-08 MED ORDER — DIPHENHYDRAMINE HCL 50 MG/ML IJ SOLN
INTRAMUSCULAR | Status: AC
  Filled 2019-11-08: qty 1

## 2019-11-08 MED ORDER — PHENYLEPHRINE HCL (PRESSORS) 10 MG/ML IV SOLN
INTRAVENOUS | Status: DC | PRN
  Administered 2019-11-08: 300 ug via INTRAVENOUS

## 2019-11-08 MED ORDER — ROCURONIUM BROMIDE 100 MG/10ML IV SOLN
INTRAVENOUS | Status: DC | PRN
  Administered 2019-11-08: 100 mg via INTRAVENOUS

## 2019-11-08 MED ORDER — FENTANYL CITRATE (PF) 100 MCG/2ML IJ SOLN
INTRAMUSCULAR | Status: DC
Start: 2019-11-08 — End: 2019-11-08
  Filled 2019-11-08: qty 2

## 2019-11-08 MED ORDER — ONDANSETRON HCL 4 MG PO TABS: 4.0000 mg | ORAL_TABLET | Freq: Four times a day (QID) | ORAL | Status: DC | PRN

## 2019-11-08 MED ORDER — DEXAMETHASONE SODIUM PHOSPHATE 10 MG/ML IJ SOLN
INTRAMUSCULAR | Status: DC | PRN
  Administered 2019-11-08: 10 mg via INTRAVENOUS

## 2019-11-08 MED ORDER — METOCLOPRAMIDE HCL 10 MG PO TABS: 5.0000 mg | ORAL_TABLET | Freq: Three times a day (TID) | ORAL | Status: DC | PRN

## 2019-11-08 MED ORDER — HYDROMORPHONE HCL 1 MG/ML IJ SOLN
INTRAMUSCULAR | Status: AC
Start: 2019-11-08 — End: 2019-11-08
  Administered 2019-11-08: 0.5 mg via INTRAVENOUS
  Filled 2019-11-08: qty 1

## 2019-11-08 MED ORDER — SODIUM CHLORIDE FLUSH 0.9 % IV SOLN
INTRAVENOUS | Status: AC
  Filled 2019-11-08: qty 50

## 2019-11-08 MED ORDER — ONDANSETRON HCL 4 MG/2ML IJ SOLN
INTRAMUSCULAR | Status: AC
  Filled 2019-11-08: qty 2

## 2019-11-08 MED ORDER — KETOROLAC TROMETHAMINE 30 MG/ML IJ SOLN: 30.0000 mg | Freq: Once | INTRAMUSCULAR | Status: AC

## 2019-11-08 MED ORDER — PROPOFOL 10 MG/ML IV BOLUS
INTRAVENOUS | Status: AC
  Filled 2019-11-08: qty 20

## 2019-11-08 MED ORDER — DEXAMETHASONE SODIUM PHOSPHATE 10 MG/ML IJ SOLN
INTRAMUSCULAR | Status: AC
  Filled 2019-11-08: qty 1

## 2019-11-08 SURGICAL SUPPLY — 62 items
APL PRP STRL LF DISP 70% ISPRP (MISCELLANEOUS) ×1
BLADE SAGITTAL AGGR TOOTH XLG (BLADE) ×2 IMPLANT
BNDG COHESIVE 6X5 TAN STRL LF (GAUZE/BANDAGES/DRESSINGS) ×6 IMPLANT
CANISTER SUCT 1200ML W/VALVE (MISCELLANEOUS) ×2 IMPLANT
CANISTER WOUND CARE 500ML ATS (WOUND CARE) ×2 IMPLANT
CHLORAPREP W/TINT 26 (MISCELLANEOUS) ×2 IMPLANT
COVER BACK TABLE REUSABLE LG (DRAPES) ×2 IMPLANT
COVER WAND RF STERILE (DRAPES) ×2 IMPLANT
DRAPE 3/4 80X56 (DRAPES) ×6 IMPLANT
DRAPE C-ARM XRAY 36X54 (DRAPES) ×2 IMPLANT
DRAPE INCISE IOBAN 66X60 STRL (DRAPES) IMPLANT
DRAPE POUCH INSTRU U-SHP 10X18 (DRAPES) ×2 IMPLANT
DRESSING SURGICEL FIBRLLR 1X2 (HEMOSTASIS) ×2 IMPLANT
DRSG MEPILEX SACRM 8.7X9.8 (GAUZE/BANDAGES/DRESSINGS) ×2 IMPLANT
DRSG OPSITE POSTOP 4X8 (GAUZE/BANDAGES/DRESSINGS) ×4 IMPLANT
DRSG SURGICEL FIBRILLAR 1X2 (HEMOSTASIS) ×4
ELECT BLADE 6.5 EXT (BLADE) ×2 IMPLANT
ELECT REM PT RETURN 9FT ADLT (ELECTROSURGICAL) ×2
ELECTRODE REM PT RTRN 9FT ADLT (ELECTROSURGICAL) ×1 IMPLANT
GLOVE BIOGEL PI IND STRL 9 (GLOVE) ×1 IMPLANT
GLOVE BIOGEL PI INDICATOR 9 (GLOVE) ×1
GLOVE SURG SYN 9.0  PF PI (GLOVE) ×2
GLOVE SURG SYN 9.0 PF PI (GLOVE) ×2 IMPLANT
GOWN SRG 2XL LVL 4 RGLN SLV (GOWNS) ×1 IMPLANT
GOWN STRL NON-REIN 2XL LVL4 (GOWNS) ×2
GOWN STRL REUS W/ TWL LRG LVL3 (GOWN DISPOSABLE) ×1 IMPLANT
GOWN STRL REUS W/TWL LRG LVL3 (GOWN DISPOSABLE) ×2
HEAD FEMORAL 28MM SZ S (Head) ×2 IMPLANT
HEMOVAC 400CC 10FR (MISCELLANEOUS) IMPLANT
HOLDER FOLEY CATH W/STRAP (MISCELLANEOUS) ×2 IMPLANT
HOOD PEEL AWAY FLYTE STAYCOOL (MISCELLANEOUS) ×2 IMPLANT
IRRIGATION SURGIPHOR STRL (IV SOLUTION) IMPLANT
KIT PREVENA INCISION MGT 13 (CANNISTER) ×2 IMPLANT
LINER DMM HIGHCR 28MM (Liner) ×2 IMPLANT
MAT ABSORB  FLUID 56X50 GRAY (MISCELLANEOUS) ×1
MAT ABSORB FLUID 56X50 GRAY (MISCELLANEOUS) ×1 IMPLANT
NDL SAFETY ECLIPSE 18X1.5 (NEEDLE) ×1 IMPLANT
NEEDLE HYPO 18GX1.5 SHARP (NEEDLE) ×2
NEEDLE SPNL 20GX3.5 QUINCKE YW (NEEDLE) ×4 IMPLANT
NS IRRIG 1000ML POUR BTL (IV SOLUTION) ×2 IMPLANT
PACK HIP COMPR (MISCELLANEOUS) ×2 IMPLANT
SCALPEL PROTECTED #10 DISP (BLADE) ×4 IMPLANT
SHELL ACETABULAR DM  62MM (Shell) ×2 IMPLANT
SOL PREP PVP 2OZ (MISCELLANEOUS) ×2
SOLUTION PREP PVP 2OZ (MISCELLANEOUS) ×1 IMPLANT
SPONGE DRAIN TRACH 4X4 STRL 2S (GAUZE/BANDAGES/DRESSINGS) ×2 IMPLANT
STAPLER SKIN PROX 35W (STAPLE) ×2 IMPLANT
STEM FEMORAL SZ3 LAT COLLARED (Stem) ×2 IMPLANT
STRAP SAFETY 5IN WIDE (MISCELLANEOUS) ×2 IMPLANT
SUT DVC 2 QUILL PDO  T11 36X36 (SUTURE) ×1
SUT DVC 2 QUILL PDO T11 36X36 (SUTURE) ×1 IMPLANT
SUT SILK 0 (SUTURE) ×2
SUT SILK 0 30XBRD TIE 6 (SUTURE) ×1 IMPLANT
SUT V-LOC 90 ABS DVC 3-0 CL (SUTURE) ×2 IMPLANT
SUT VIC AB 1 CT1 36 (SUTURE) ×2 IMPLANT
SYR 20ML LL LF (SYRINGE) ×2 IMPLANT
SYR 30ML LL (SYRINGE) ×2 IMPLANT
SYR 50ML LL SCALE MARK (SYRINGE) ×4 IMPLANT
SYR BULB IRRIG 60ML STRL (SYRINGE) ×2 IMPLANT
TAPE MICROFOAM 4IN (TAPE) ×2 IMPLANT
TOWEL OR 17X26 4PK STRL BLUE (TOWEL DISPOSABLE) ×2 IMPLANT
TRAY FOLEY MTR SLVR 16FR STAT (SET/KITS/TRAYS/PACK) ×2 IMPLANT

## 2019-11-08 NOTE — Op Note (Signed)
11/08/2019  8:58 AM  PATIENT:  Theodore Garcia  49 y.o. 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  POST-OPERATIVE DIAGNOSIS: Same  PROCEDURE:  Procedure(s): TOTAL HIP ARTHROPLASTY ANTERIOR APPROACH (Right)  SURGEON: Leitha Schuller, MD  ASSISTANTS: None  ANESTHESIA:   general  EBL:  Total I/O In: -  Out: 250 [Blood:250]  BLOOD ADMINISTERED:none  DRAINS: Incisional wound VAC   LOCAL MEDICATIONS USED:  MARCAINE    and OTHER Exparel  SPECIMEN:  Source of Specimen:  Right femoral head  DISPOSITION OF SPECIMEN:  PATHOLOGY  COUNTS:  YES  TOURNIQUET:  * No tourniquets in log *  IMPLANTS: Medacta AMIS 3 lateralized stem, 62 mm Mpact DM cup and liner with ceramic S 28 mm head  DICTATION: .Dragon Dictation   The patient was brought to the operating room and after general anesthesia was obtained patient was placed on the operative table with the ipsilateral foot into the Medacta attachment, contralateral leg on a well-padded table. C-arm was brought in and preop template x-ray taken. After prepping and draping in usual sterile fashion appropriate patient identification and timeout procedures were completed. Anterior approach to the hip was obtained and centered over the greater trochanter and TFL muscle. The subcutaneous tissue was incised hemostasis being achieved by electrocautery. TFL fascia was incised and the muscle retracted laterally deep retractor placed. The lateral femoral circumflex vessels were identified and ligated. The anterior capsule was exposed and a capsulotomy performed. The neck was identified and a femoral neck cut carried out with a saw. The head was removed without difficulty and showed sclerotic femoral head with some collapse and acetabulum having extensive inflammatory tissue medially. Reaming was carried out to 62 mm and a 62 mm cup trial gave appropriate tightness to the acetabular component a 62 DM cup was impacted into  position. The leg was then externally rotated and ischiofemoral and pubofemoral releases carried out. The femur was sequentially broached to a size 3, size 3 standard and 3 lateralized stem with S head trials were placed and the final components chosen. The 3 lateralized stem was inserted along with a ceramic S 28 mm head and 62 mm liner. The hip was reduced and was stable the wound was thoroughly irrigated with fibrillar placed along the posterior capsule and medial neck. The deep fascia ws closed using a heavy Quill after infiltration of 30 cc of quarter percent Sensorcaine with epinephrine mixed with Exparel throughout the case .3-0 V-loc to close the skin with skin staples.  Incisional wound VAC applied and patient was sent to recovery in stable condition.   PLAN OF CARE: Discharge to home after PACU

## 2019-11-08 NOTE — Anesthesia Preprocedure Evaluation (Signed)
Anesthesia Evaluation  Patient identified by MRN, date of birth, ID band Patient awake    Reviewed: Allergy & Precautions, H&P , NPO status , Patient's Chart, lab work & pertinent test results, reviewed documented beta blocker date and time   Airway Mallampati: II  TM Distance: >3 FB Neck ROM: full    Dental  (+) Teeth Intact   Pulmonary asthma , Current Smoker,    Pulmonary exam normal        Cardiovascular Exercise Tolerance: Good hypertension, On Medications negative cardio ROS Normal cardiovascular exam Rhythm:regular Rate:Normal     Neuro/Psych PSYCHIATRIC DISORDERS Anxiety Depression negative neurological ROS     GI/Hepatic Neg liver ROS, GERD  Medicated,  Endo/Other  negative endocrine ROS  Renal/GU negative Renal ROS  negative genitourinary   Musculoskeletal   Abdominal   Peds  Hematology negative hematology ROS (+)   Anesthesia Other Findings Past Medical History: No date: ADHD (attention deficit hyperactivity disorder) No date: Anxiety No date: Asthma No date: Depression No date: Eczema No date: GERD (gastroesophageal reflux disease) No date: Hyperlipidemia No date: Hypertension No date: Osteoporosis No date: Seasonal allergies Past Surgical History: No date: BONE GRAFT HIP ILIAC CREST     Comment:  left hip No date: craniotomy     Comment:  for closed fontanella 05/23/2015: TIBIA IM NAIL INSERTION; Right     Comment:  Procedure: INTRAMEDULLARY (IM) NAIL TIBIAL;  Surgeon:               Deeann Saint, MD;  Location: [facility] ORS;  Service:               Orthopedics;  Laterality: Right; No date: TOTAL HIP ARTHROPLASTY     Comment:  Left hip No date: WISDOM TOOTH EXTRACTION No date: WRIST FRACTURE SURGERY     Comment:  Right wrist   Reproductive/Obstetrics negative OB ROS                             Anesthesia Physical Anesthesia Plan  ASA: II  Anesthesia Plan:  General ETT   Post-op Pain Management:    Induction:   PONV Risk Score and Plan: 2  Airway Management Planned:   Additional Equipment:   Intra-op Plan:   Post-operative Plan:   Informed Consent: I have reviewed the patients History and Physical, chart, labs and discussed the procedure including the risks, benefits and alternatives for the proposed anesthesia with the patient or authorized representative who has indicated his/her understanding and acceptance.     Dental Advisory Given  Plan Discussed with: CRNA  Anesthesia Plan Comments:         Anesthesia Quick Evaluation

## 2019-11-08 NOTE — Discharge Instructions (Addendum)
Total Hip Replacement, Anterior, Care After This sheet gives you information about how to care for yourself after your procedure. Your doctor may also give you more specific instructions. If you have problems or questions, contact your doctor. What can I expect after the procedure? After the procedure, it is common to have:  Pain.  Stiffness.  Discomfort. Follow these instructions at home: Medicines  Take over-the-counter and prescription medicines only as told by your doctor.  If you were prescribed a medicine to thin your blood (anticoagulant), take it as told by your doctor. Surgery cut care   Follow instructions from your doctor about how to take care of your cut (incision) from surgery. Make sure you: ? Wash your hands with soap and water before you change your bandage (dressing). If you cannot use soap and water, use alcohol-based hand sanitizer. ? Change your bandage as told by your doctor. ? Leave stitches (sutures), skin glue, or skin tape (adhesive) strips in place. They may need to stay in place for 2 weeks or longer. If tape strips get loose and curl up, you may trim the loose edges. Do not remove tape strips completely unless your doctor tells you to do that.  Check your surgical cut every day for signs of infection. Check for: ? Redness, swelling, or pain. ? Fluid or blood. ? Warmth. ? Pus or a bad smell. Bathing  Do not take baths, swim, or use a hot tub until your doctor says it is okay.  Keep the bandage dry until your doctor says it can be removed. Managing pain, stiffness, and swelling   If directed, put ice on the hip area. ? Put ice in a plastic bag. ? Place a towel between your skin and the bag. ? Leave the ice on for 20 minutes, 2-3 times a day.  Move your toes often to avoid stiffness and to lessen swelling.  Raise (elevate) your leg above the level of your heart while you are sitting or lying down. Activity  Rest as told by your doctor.  Do  not sit for a long time without moving. Get up to take short walks every 1-2 hours. This is important. Ask for help if you feel weak or unsteady.  Do exercises as told by your doctor or physical therapist.  Use a walker, crutches, or a cane as told by your doctor. ? You may use your legs to support (bear) your body weight as told by your doctor. Follow instructions about how much weight you may safely support on your affected leg (weight-bearing restrictions). ? A physical therapist may show you how to get out of a bed and chair and how to go up and down stairs. You will first do this with a walker, crutches, or a cane. Then you will do it without any of these devices. ? Once you are able to walk without a limp, you may stop using a walker, crutches, or cane.  Return to your normal activities as told by your doctor. Ask your doctor what activities are safe for you. Safety  To help prevent falls: ? Keep floors clear of objects you may trip over. ? Place items that you may need within easy reach.  Wear an apron or tool belt with pockets for carrying objects. This leaves your hands free to help with your balance. Driving  Do not drive or use heavy machinery while taking prescription pain medicine.  Ask your doctor when it is safe to drive. General instructions    Wear compression stockings as told by your doctor. These help to prevent blood clots and reduce swelling in your legs.  Keep doing breathing exercises as told by your doctor. This helps prevent lung infection.  If you are taking prescription pain medicine, take actions to prevent or treat constipation. Your doctor may suggest that you: ? Drink enough fluid to keep your pee (urine) pale yellow. ? Eat foods that are high in fiber. These include fresh fruits and vegetables, whole grains, and beans. ? Limit foods that are high in fat and sugar. These include fried or sweet foods. ? Take an over-the-counter or prescription medicine for  constipation.  Do not use any products that have nicotine or tobacco in them, such as cigarettes and e-cigarettes. These can delay bone healing. If you need help quitting, ask your doctor.  Tell your doctor if you plan to have dental work. Also: ? Tell your dentist about your joint replacement. ? Ask your doctor if there are instructions you need to follow before dental care and routine cleanings.  Keep all follow-up visits as told by your doctor. This is important. Contact a doctor if:  You have a fever or chills.  You have a cough.  You feel short of breath.  Your medicine is not helping your pain.  You have any of these at or near your cut from surgery: ? Redness, swelling, or pain. ? Fluid or blood. ? An area that feels warm when you touch it. ? Pus or a bad smell. Get help right away if:  You have very bad pain.  You have trouble breathing.  You have chest pain.  You have redness, swelling, pain, and warmth in your calf or leg. Summary  Follow instructions from your doctor about how to take care of your surgery cut (incision).  Do not take baths, swim, or use a hot tub until your doctor says it is okay.  Use crutches, a walker, or a cane as told by your doctor.  If you were prescribed a medicine to thin your blood (anticoagulant), take it as told by your doctor. This information is not intended to replace advice given to you by your health care provider. Make sure you discuss any questions you have with your health care provider. Document Revised: 06/21/2018 Document Reviewed: 05/27/2017 Elsevier Patient Education  2020 [employer]   AMBULATORY SURGERY  DISCHARGE INSTRUCTIONS   1) The drugs that you were given will stay in your system until tomorrow so for the next 24 hours you should not:  A) Drive an automobile B) Make any legal decisions C) Drink any alcoholic beverage   2) You may resume regular meals tomorrow.  Today it is better to start with  liquids and gradually work up to solid foods.  You may eat anything you prefer, but it is better to start with liquids, then soup and crackers, and gradually work up to solid foods.   3) Please notify your doctor immediately if you have any unusual bleeding, trouble breathing, redness and pain at the surgery site, drainage, fever, or pain not relieved by medication.    4) Additional Instructions:        Please contact your physician with any problems or Same Day Surgery at [phone], Monday through Friday 6 am to 4 pm, or [facility] number at [phone]. 

## 2019-11-08 NOTE — Transfer of Care (Signed)
Immediate Anesthesia Transfer of Care Note  Patient: Theodore Garcia  Procedure(s) Performed: TOTAL HIP ARTHROPLASTY ANTERIOR APPROACH (Right Hip)  Patient Location: PACU  Anesthesia Type:General  Level of Consciousness: awake and pateint uncooperative  Airway & Oxygen Therapy: Patient Spontanous Breathing and Patient connected to face mask oxygen  Post-op Assessment: Report given to RN and Post -op Vital signs reviewed and stable  Post vital signs: Reviewed and stable  Last Vitals:  Vitals Value Taken Time  BP 124/85 11/08/19 0902  Temp 36.7 C 11/08/19 0902  Pulse 89 11/08/19 0904  Resp 26 11/08/19 0904  SpO2 94 % 11/08/19 0904  Vitals shown include unvalidated device data.  Last Pain:  Vitals:   11/08/19 0626  PainSc: 8          Complications: No complications documented.

## 2019-11-08 NOTE — H&P (Signed)
Chief Complaint  Patient presents with  . Hip Pain  RIGHT HIP   History of the Present Illness: Theodore Garcia is a 49 y.o. 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 I had recommended total hip arthroplasty, but his insurance denied it because he continued to smoke. He comes back today to discuss whether he is still smoking, and if we can proceed with surgery. His last x-rays were in 06/2019, and he had early collapse. My fear is that if he has persistent worsening collapse, he could end up losing acetabular bone and have a much worse outcome.   The patient states he has stopped smoking. He states his pain has worsened. The patient states that he has been having right hip pain that radiates to the medial aspect of the right knee. He has been using a heating pad for pain relief. He adds that he has been falling due to his right hip pain. He states he feels the inactivity in his heart as well.   His primary care physician is Rhona Leavens. Burnett Sheng, MD. The patient has had a prior right knee fracture with surgery and rod placement.   He states he works 3 days a week for 5 hours.  I have reviewed past medical, surgical, social and family history, and allergies as documented in the EMR.  Past Medical History: Past Medical History:  Diagnosis Date  . ADHD (attention deficit hyperactivity disorder), inattentive type  . Allergic state  . Anxiety  . Depression  . Dislocation of left hip (CMS-HCC)  s/p replacement  . Hyperlipidemia  . Hypertension  questionable   Past Surgical History: Past Surgical History:  Procedure Laterality Date  . Bone graft 2006  . COLONOSCOPY 06/29/2017  Tubular adenoma of the colon/repeat 2yrs/[facility]  . CRANIOTOMY EXPLORATORY  . FREE VASCULAR SX  . JOINT REPLACEMENT 12/30/2006  total hip replacement  . Right Leg Fracture Right 04/2015  . WISDOM TEETH  . WRIST SX Right   Past Family History: Family History  Problem Relation Age  of Onset  . Melanoma Mother  . Osteoporosis (Thinning of bones) Mother  . Heart disease Mother  . Colon polyps Mother  . Coronary Artery Disease (Blocked arteries around heart) Father  . Heart disease Other  Grandparents  . High blood pressure (Hypertension) Other  Grandparents  . Diabetes Other  Grandparents  . Asthma Other  Grandparents  . Osteoporosis (Thinning of bones) Other  Grandparents  . Colon cancer Maternal Uncle   Medications: Current Outpatient Medications Ordered in Epic  Medication Sig Dispense Refill  . ALPRAZolam (XANAX) 0.5 MG tablet Take 1 tablet (0.5 mg total) by mouth 3 (three) times a day 90 tablet 3  . aspirin 81 MG EC tablet Take 1 tablet (81 mg total) by mouth once daily 30 tablet 11  . azelastine (ASTELIN) 137 mcg nasal spray  . budesonide-formoterol (SYMBICORT) 80-4.5 mcg/actuation inhaler Inhale 1 inhalation into the lungs 2 (two) times daily.  Marland Kitchen dextroamphetamine-amphetamine (ADDERALL XR) 25 MG XR capsule Take 1 capsule (25 mg total) by mouth once daily for 30 days 30 capsule 0  . [START ON 11/11/2019] dextroamphetamine-amphetamine (ADDERALL XR) 25 MG XR capsule Take 1 capsule (25 mg total) by mouth once daily for 30 days 30 capsule 0  . [START ON 12/11/2019] dextroamphetamine-amphetamine (ADDERALL XR) 25 MG XR capsule Take 1 capsule (25 mg total) by mouth once daily for 30 days 30 capsule 0  . dextroamphetamine-amphetamine (ADDERALL) 10 mg tablet  Take 1 tablet (10 mg total) by mouth every evening 30 tablet 0  . [START ON 12/11/2019] dextroamphetamine-amphetamine (ADDERALL) 10 mg tablet Take 1 tablet (10 mg total) by mouth every evening for 30 days 30 tablet 0  . [START ON 11/01/2019] dextroamphetamine-amphetamine (ADDERALL) 10 mg tablet Take 1 tablet (10 mg total) by mouth every evening for 30 days 30 tablet 0  . DUPIXENT 300 mg/2 mL Syrg monthly 10  . EPINEPHrine (EPIPEN) 0.3 mg/0.3 mL pen injector epinephrine 0.3 mg/0.3 mL injection, auto-injector  .  etodolac (LODINE) 500 MG tablet Take 1 tablet by mouth 2 (two) times daily  . fenofibrate nanocrystallized (TRICOR) 48 MG tablet Take 1 tablet (48 mg total) by mouth once daily 30 tablet 11  . fluticasone furoate-vilanterol (BREO ELLIPTA) 100-25 mcg/dose DsDv inhaler Breo Ellipta 100 mcg-25 mcg/dose powder for inhalation  . HYDROcodone-acetaminophen (NORCO) 5-325 mg tablet Take 1-2 tablets by mouth every 6 (six) hours as needed 30 tablet 0  . lisinopriL (ZESTRIL) 20 MG tablet Take 1 tablet (20 mg total) by mouth once daily 90 tablet 3  . omega-3 acid ethyl esters (LOVAZA) 1 gram capsule Take 2 capsules (2 g total) by mouth 2 (two) times daily 120 capsule 11  . omeprazole (PRILOSEC) 20 MG DR capsule  . pimecrolimus (ELIDEL) 1 % cream Elidel 1 % topical cream  . PROAIR HFA 90 mcg/actuation inhaler  . rosuvastatin (CRESTOR) 40 MG tablet Take 1 tablet (40 mg total) by mouth once daily 30 tablet 11  . syringe with needle, 1 mL (BD ALLERGY SYRINGE MISC) Use  . tiZANidine (ZANAFLEX) 4 MG tablet TAKE 1 TABLET BY MOUTH AT BEDTIME AS NEEDED 30 tablet 1  . diltiazem (CARDIZEM CD) 120 MG XR capsule Take 1 capsule (120 mg total) by mouth once daily (Patient not taking: Reported on 10/26/2019 ) 60 capsule 3  . gabapentin (NEURONTIN) 300 MG capsule Take 1 capsule (300 mg total) by mouth nightly for 60 doses Increase to 2 at night time after 1 week, if no improvement. 60 capsule 1  . HYDROcodone-acetaminophen (NORCO) 7.5-325 mg tablet Take 1 tablet by mouth every 6 (six) hours as needed for Pain 40 tablet 0  . traMADoL (ULTRAM) 50 mg tablet Take 1 tablet (50 mg total) by mouth every 6 (six) hours as needed for Pain for up to 30 doses (Patient not taking: Reported on 10/26/2019 ) 30 tablet 0   No current Epic-ordered facility-administered medications on file.   Allergies: Allergies  Allergen Reactions  . Cefazolin Anaphylaxis  . Cleocin [Clindamycin Hcl] Other (See Comments)  Throat closing/swelling/difficulty  breathing  . Penicillins Unknown, Other (See Comments), Rash and Swelling  Reaction: Tongue swelling  Has patient had a PCN reaction causing immediate rash, facial/tongue/throat swelling, SOB or lightheadedness with hypotension: Yes Has patient had a PCN reaction causing severe rash involving mucus membranes or skin necrosis: No Has patient had a PCN reaction that required hospitalization No Has patient had a PCN reaction occurring within the last 10 years: No If all of the above answers are "NO", then may proceed with Cephalosporin use.    Body mass index is 24.53 kg/m.  Review of Systems: A comprehensive 14 point ROS was performed, reviewed, and the pertinent orthopaedic findings are documented in the HPI.  Vitals:  10/26/19 1400  BP: 134/86   General Physical Examination:  General/Constitutional: No apparent distress: well-nourished and well developed. Eyes: Pupils equal, round with synchronous movement. Lungs: Clear to auscultation HEENT: Normal Vascular: No edema,  swelling or tenderness, except as noted in detailed exam. Cardiac: Heart rate and rhythm is regular. Integumentary: No impressive skin lesions present, except as noted in detailed exam. Neuro/Psych: Normal mood and affect, oriented to person, place and time.  Musculoskeletal Examination: On exam, the patient is noted to have a very antalgic gait. He is unable to perform a right straight leg raise. He has a right knee scar from a prior surgery. Right hip with 10 degrees internal rotation and 20 degrees external.  Radiographs: AP pelvis and lateral x-rays of the right hip were ordered and personally reviewed today. These show significant progression of the collapse of his femoral head. The central third is sunken in with irregularity to the entire femoral head.  X-ray Impression Significant progression of avascular necrosis.  Assessment: ICD-10-CM  1. Avascular necrosis of bone of right hip (CMS-HCC) M87.051    Plan: The patient has clinical findings of significant progression of avascular necrosis of the right hip.  We discussed the patient's x-ray findings. I explained if he does not undergo surgery, he will get so weak that he will not get better. I advised him to use a walker to prevent breaking something. The patient was electronically prescribed pain medication to [facility].  Scribe Attestation: I, Dawn Royse, am acting as scribe for [employer], MD    Electronically signed by Marlena Clipper, MD at 10/26/2019 7:29 PM EDT   Reviewed paper H+P, No changes noted.

## 2019-11-08 NOTE — Anesthesia Procedure Notes (Signed)
Procedure Name: Intubation Performed by: Gaynelle Cage, CRNA Pre-anesthesia Checklist: Patient identified, Emergency Drugs available, Suction available and Patient being monitored Patient Re-evaluated:Patient Re-evaluated prior to induction Oxygen Delivery Method: Circle system utilized Preoxygenation: Pre-oxygenation with 100% oxygen Induction Type: IV induction Ventilation: Mask ventilation without difficulty Laryngoscope Size: Mac and 3 Grade View: Grade III Tube type: Oral Tube size: 8.0 mm Number of attempts: 1 Airway Equipment and Method: Oral airway Placement Confirmation: ETT inserted through vocal cords under direct vision,  positive ETCO2 and breath sounds checked- equal and bilateral Secured at: 24 cm Tube secured with: Tape Dental Injury: Teeth and Oropharynx as per pre-operative assessment  Comments: Would recommend 4 MAC blade in future

## 2019-11-08 NOTE — Evaluation (Addendum)
Physical Therapy Evaluation Patient Details Name: Theodore Garcia MRN: [id number] DOB: 21-Nov-1970 Today's Date: 11/08/2019   History of Present Illness  seen in PACU area s/p R THA, anterior approach, WBAT (11/08/19)  Clinical Impression  Upon arrival to PACU bay, patient supine in bed; appears generally agitated at situation and continued presence in hospital. Generally agreeable to participation with session, but appears simply as a means towards discharge; limited receptiveness to education/cuing, limited integration of education provided during session. Rates R hip pain 2/10, minimally changed with activity and WBing.  Does demonstrate fair/good isolated strength and movement to R hip.  Able to complete bed mobility with close sup; sit/stand, basic transfers and gait (125' x2) with RW, cga/close sup.  Gait pattern significant for reciprocal stepping pattern  with good step height/length; generally brisk, impulsive cadence; frequently picking walker up from ground.  Poor safety awareness/insight into mobility status and movement precautions; generally disinterested in therapist cuing/education with gait efforts Did provide education and issued handout with written/pictorial descriptions of supine therex, movement precautions; reviewed car transfer technique, recommendations for continued use of RW at all times.  Patient nodded in agreement to education provided, but maintained eyes closed majority of the conversation, generally disengaged with education. Remains globally impulsive, agitated and insistent on upcoming discharge; frustrated with continued stay for antibiotic dosing. Of note, mother present throughout portion of session to receive education.  Patient, however, insistent that he is "not going to her house" and becomes slightly more agitated when attempting to reason with him through safest discharge plan. Mother voicing concern about SDDC process, stating "he was able to stay three nights  when he had his other hip done" and consistently interjecting that "he's not going to do anything that you tell him to do" during education attempts.  Mother ultimately walking out prior to completion of session (after growing frustrated with son) after becoming frustrated with patient.  RN informed/aware of situation and interaction with patient/mother. Would benefit from skilled PT to address above deficits and promote optimal return to PLOF.; Recommend transition to HHPT upon discharge from acute hospitalization.      Follow Up Recommendations Home health PT    Equipment Recommendations   (has RW at home per patient report)    Recommendations for Other Services       Precautions / Restrictions Precautions Precautions: Anterior Hip;Fall Restrictions Weight Bearing Restrictions: Yes RLE Weight Bearing: Weight bearing as tolerated      Mobility  Bed Mobility Overal bed mobility: Needs Assistance Bed Mobility: Supine to Sit     Supine to sit: Supervision        Transfers Overall transfer level: Needs assistance Equipment used: Rolling walker (2 wheeled) Transfers: Sit to/from Stand Sit to Stand: Supervision;Min guard         General transfer comment: cuing for consistent use of RW, often attempts to stand prior to cuing or walker positioning.  Encouraged for R LE slightly anterior to BOS with movement transitions for optimal pain control; limited receptiveness to and integration of cuing noted  Ambulation/Gait Ambulation/Gait assistance: Supervision;Min guard Gait Distance (Feet):  (125' x2) Assistive device: Rolling walker (2 wheeled)       General Gait Details: reciprocal stepping pattern  with good step height/length; generally brisk, impulsive cadence; frequently picking walker up from ground.  Generally disinterested in therapist cuing/education with gait efforts  Stairs Stairs: Yes Stairs assistance: Min guard Stair Management: Two rails Number of Stairs:   (4' x2) General stair comments: educated in  step to gait pattern for stair ascension, though patient impulsive and refuses trial of step, performing reciprocal stepping pattern with each trial; does descend with step to gait pattern, min cuing for technique  Wheelchair Mobility    Modified Rankin (Stroke Patients Only)       Balance Overall balance assessment: Needs assistance Sitting-balance support: No upper extremity supported;Feet supported Sitting balance-Leahy Scale: Good     Standing balance support: Bilateral upper extremity supported Standing balance-Leahy Scale: Fair                               Pertinent Vitals/Pain Pain Assessment: 0-10 Pain Score: 2  Pain Location: R hip Pain Descriptors / Indicators: Aching;Guarding;Grimacing Pain Intervention(s): Limited activity within patient's tolerance;Monitored during session;Repositioned;Premedicated before session    Home Living Family/patient expects to be discharged to:: Private residence Living Arrangements: Alone   Type of Home: House Home Access: Stairs to enter Entrance Stairs-Rails: Right;Left;Can reach both Secretary/administrator of Steps: 6 Home Layout: One level Home Equipment: Walker - 2 wheels      Prior Function Level of Independence: Independent         Comments: Indep with ADLs, household and community mobilization without assist device; does endorse at least 2 falls in previous six months due to R hip giving way/pain     Hand Dominance        Extremity/Trunk Assessment   Upper Extremity Assessment Upper Extremity Assessment: Overall WFL for tasks assessed    Lower Extremity Assessment Lower Extremity Assessment:  (R hip grossly 3-/5, fair/good isolated strength and ROM to R hip; L LE grossly WFL)       Communication   Communication: No difficulties  Cognition Arousal/Alertness: Awake/alert Behavior During Therapy: Restless;Agitated Overall Cognitive Status: Within  Functional Limits for tasks assessed                                        General Comments      Exercises Other Exercises Other Exercises: Seated R LE therex, 1x15, active ROM: ankle pumps, LAQs, marching.  Good isolated strength and movement to R hip with seated therex; consistent cuing for slow, controlled movements required Other Exercises: Issued handout with written/pictorial descriptions of supine therex, movement precautions; reviewed car transfer technique, recommendations for continued use of RW at all times.  Patient nodded in agreement to education provided, but maintained eyes closed majority of the conversation. Remains generally impulsive and insistent on upcoming discharge.  Toilet transfer, ambulatory with RW, cga/close sup; attempts to pick walker up/step away from walker despite cuing from therapist.  Sit/stand from standard toilet height, close sup.   Assessment/Plan    PT Assessment Patient needs continued PT services  PT Problem List Decreased strength;Decreased range of motion;Decreased activity tolerance;Decreased balance;Decreased mobility;Decreased knowledge of use of DME;Decreased safety awareness;Decreased knowledge of precautions;Pain       PT Treatment Interventions DME instruction;Gait training;Stair training;Functional mobility training;Therapeutic activities;Therapeutic exercise;Patient/family education;Cognitive remediation;Balance training    PT Goals (Current goals can be found in the Care Plan section)  Acute Rehab PT Goals Patient Stated Goal: to get my computer and get out of here PT Goal Formulation: With patient Time For Goal Achievement: 11/22/19 Potential to Achieve Goals: Good    Frequency BID   Barriers to discharge Decreased caregiver support      Co-evaluation  AM-PAC PT "6 Clicks" Mobility  Outcome Measure Help needed turning from your back to your side while in a flat bed without using  bedrails?: None Help needed moving from lying on your back to sitting on the side of a flat bed without using bedrails?: None Help needed moving to and from a bed to a chair (including a wheelchair)?: A Little Help needed standing up from a chair using your arms (e.g., wheelchair or bedside chair)?: A Little Help needed to walk in hospital room?: A Little Help needed climbing 3-5 steps with a railing? : A Little 6 Click Score: 20    End of Session Equipment Utilized During Treatment: Gait belt Activity Tolerance: Treatment limited secondary to agitation Patient left: in chair;with call bell/phone within reach Nurse Communication: Mobility status PT Visit Diagnosis: Muscle weakness (generalized) (M62.81);Difficulty in walking, not elsewhere classified (R26.2);Pain Pain - Right/Left: Right Pain - part of body: Hip    Time: 7544-9201 PT Time Calculation (min) (ACUTE ONLY): 30 min   Charges:   PT Evaluation $PT Eval Moderate Complexity: 1 Mod PT Treatments $Therapeutic Exercise: 8-22 mins $Therapeutic Activity: 8-22 mins        Carlin Attridge H. Manson Passey, PT, DPT, NCS 11/08/19, 2:40 PM [phone]

## 2019-11-08 NOTE — Anesthesia Postprocedure Evaluation (Signed)
Anesthesia Post Note  Patient: Malachy Coleman  Procedure(s) Performed: TOTAL HIP ARTHROPLASTY ANTERIOR APPROACH (Right Hip)  Patient location during evaluation: PACU Anesthesia Type: General Level of consciousness: awake and alert Pain management: pain level controlled Vital Signs Assessment: post-procedure vital signs reviewed and stable Respiratory status: spontaneous breathing, nonlabored ventilation, respiratory function stable and patient connected to nasal cannula oxygen Cardiovascular status: blood pressure returned to baseline and stable Postop Assessment: no apparent nausea or vomiting Anesthetic complications: no   No complications documented.   Last Vitals:  Vitals:   11/08/19 0917 11/08/19 0948  BP:  (!) 152/92  Pulse:    Resp: 15   Temp:    SpO2:      Last Pain:  Vitals:   11/08/19 0948  PainSc: Asleep                 Yevette Edwards

## 2019-11-09 ENCOUNTER — Encounter: Payer: Self-pay | Admitting: Orthopedic Surgery

## 2019-11-11 LAB — SURGICAL PATHOLOGY

## 2020-02-21 ENCOUNTER — Ambulatory Visit: Payer: 59

## 2021-01-15 ENCOUNTER — Ambulatory Visit: Payer: 59 | Attending: Internal Medicine

## 2021-01-15 ENCOUNTER — Other Ambulatory Visit: Payer: Self-pay

## 2021-01-15 DIAGNOSIS — Z23 Encounter for immunization: Secondary | ICD-10-CM

## 2021-01-15 MED ORDER — PFIZER COVID-19 VAC BIVALENT 30 MCG/0.3ML IM SUSP
INTRAMUSCULAR | 0 refills | Status: DC
  Filled 2021-01-15: qty 0.3, 1d supply, fill #0

## 2021-01-15 NOTE — Progress Notes (Signed)
   Covid-19 Vaccination Clinic  Name:  Theodore Garcia    MRN: [id number] DOB: February 21, 1971  01/15/2021  [name] was observed post Covid-19 immunization for 15 minutes without incident. He was provided with Vaccine Information Sheet and instruction to access the V-Safe system.   [name] was instructed to call 911 with any severe reactions post vaccine: Difficulty breathing  Swelling of face and throat  A fast heartbeat  A bad rash all over body  Dizziness and weakness   Immunizations Administered     Name Date Dose VIS Date Route   Pfizer Covid-19 Vaccine Bivalent Booster 01/15/2021 11:02 AM 0.3 mL 10/24/2020 Intramuscular   Manufacturer: [employer], [employer]   Lot: [id number]   NDC: [address]-2      Drusilla Kanner, PharmD, MBA Clinical Acute Care Pharmacist

## 2021-02-06 ENCOUNTER — Other Ambulatory Visit: Payer: Self-pay | Admitting: Internal Medicine

## 2021-02-06 DIAGNOSIS — I208 Other forms of angina pectoris: Secondary | ICD-10-CM

## 2021-02-06 DIAGNOSIS — R079 Chest pain, unspecified: Secondary | ICD-10-CM

## 2021-02-07 ENCOUNTER — Other Ambulatory Visit (HOSPITAL_COMMUNITY): Payer: Self-pay | Admitting: Emergency Medicine

## 2021-02-07 DIAGNOSIS — R072 Precordial pain: Secondary | ICD-10-CM

## 2021-02-07 MED ORDER — IVABRADINE HCL 5 MG PO TABS: 10.0000 mg | ORAL_TABLET | Freq: Once | ORAL | 0 refills | Status: AC

## 2021-02-07 MED ORDER — METOPROLOL TARTRATE 100 MG PO TABS: 100.0000 mg | ORAL_TABLET | Freq: Once | ORAL | 0 refills | Status: DC

## 2021-02-11 ENCOUNTER — Ambulatory Visit: Admission: RE | Admit: 2021-02-11 | Payer: 59 | Source: Ambulatory Visit

## 2021-02-28 ENCOUNTER — Telehealth (HOSPITAL_COMMUNITY): Payer: Self-pay | Admitting: *Deleted

## 2021-02-28 NOTE — Telephone Encounter (Signed)
Attempted to call patient regarding upcoming cardiac CT appointment. °Left message on voicemail with name and callback number ° °Lidwina Kaner RN Navigator Cardiac Imaging °[facility] °[phone] Office °[phone] Cell ° °

## 2021-03-01 ENCOUNTER — Telehealth (HOSPITAL_COMMUNITY): Payer: Self-pay | Admitting: *Deleted

## 2021-03-01 NOTE — Telephone Encounter (Signed)
Reaching out to patient's mother to offer assistance regarding upcoming cardiac imaging study; pt's mother verbalizes understanding of appt date/time, parking situation and where to check in, pre-test NPO status and medications ordered, and verified current allergies; name and call back number provided for further questions should they arise  Larey Brick RN Navigator Cardiac Imaging [facility] [phone] office [phone] cell  Patient to take 100mg  metoprolol tartrate and 10mg  ivabradine two hours prior to cardiac CT scan.

## 2021-03-04 ENCOUNTER — Other Ambulatory Visit: Payer: Self-pay | Admitting: Internal Medicine

## 2021-03-04 ENCOUNTER — Ambulatory Visit
Admission: RE | Admit: 2021-03-04 | Discharge: 2021-03-04 | Disposition: A | Discharge status: Home / Self Care | Payer: 59 | Source: Ambulatory Visit | Attending: Internal Medicine | Admitting: Internal Medicine

## 2021-03-04 ENCOUNTER — Other Ambulatory Visit: Payer: Self-pay

## 2021-03-04 DIAGNOSIS — I208 Other forms of angina pectoris: Secondary | ICD-10-CM | POA: Diagnosis present

## 2021-03-04 DIAGNOSIS — R931 Abnormal findings on diagnostic imaging of heart and coronary circulation: Secondary | ICD-10-CM | POA: Diagnosis not present

## 2021-03-04 DIAGNOSIS — R079 Chest pain, unspecified: Secondary | ICD-10-CM | POA: Insufficient documentation

## 2021-03-04 DIAGNOSIS — I2510 Atherosclerotic heart disease of native coronary artery without angina pectoris: Secondary | ICD-10-CM | POA: Diagnosis not present

## 2021-03-04 LAB — POCT I-STAT CREATININE: Creatinine, Ser: 1 mg/dL (ref 0.61–1.24)

## 2021-03-04 MED ORDER — METOPROLOL TARTRATE 5 MG/5ML IV SOLN
10.0000 mg | Freq: Once | INTRAVENOUS | Status: AC
  Administered 2021-03-04: 10 mg via INTRAVENOUS

## 2021-03-04 MED ORDER — IOHEXOL 350 MG/ML SOLN
75.0000 mL | Freq: Once | INTRAVENOUS | Status: AC | PRN
  Administered 2021-03-04: 75 mL via INTRAVENOUS

## 2021-03-04 MED ORDER — NITROGLYCERIN 0.4 MG SL SUBL
0.8000 mg | SUBLINGUAL_TABLET | Freq: Once | SUBLINGUAL | Status: AC
  Administered 2021-03-04: 0.8 mg via SUBLINGUAL

## 2021-03-04 NOTE — Progress Notes (Signed)
Patient tolerated procedure well. Ambulate w/o difficulty. Denies light headedness or being dizzy. Sitting in chair drinking water provided. Encouraged to drink extra water today and reasoning explained. Verbalized understanding. All questions answered. ABC intact. No further needs. Discharge from procedure area w/o issues.   °

## 2021-03-06 DIAGNOSIS — I2510 Atherosclerotic heart disease of native coronary artery without angina pectoris: Secondary | ICD-10-CM | POA: Diagnosis not present

## 2021-03-06 DIAGNOSIS — R931 Abnormal findings on diagnostic imaging of heart and coronary circulation: Secondary | ICD-10-CM | POA: Diagnosis not present

## 2021-03-12 ENCOUNTER — Other Ambulatory Visit
Admission: RE | Admit: 2021-03-12 | Discharge: 2021-03-12 | Disposition: A | Discharge status: Home / Self Care | Payer: 59 | Source: Ambulatory Visit | Attending: Student | Admitting: Student

## 2021-03-12 DIAGNOSIS — Z01818 Encounter for other preprocedural examination: Secondary | ICD-10-CM | POA: Insufficient documentation

## 2021-03-12 LAB — BRAIN NATRIURETIC PEPTIDE: B Natriuretic Peptide: 11.4 pg/mL (ref 0.0–100.0)

## 2021-03-21 ENCOUNTER — Encounter: Admission: RE | Payer: Self-pay | Source: Home / Self Care

## 2021-03-21 ENCOUNTER — Ambulatory Visit: Admission: RE | Admit: 2021-03-21 | Payer: 59 | Source: Home / Self Care | Admitting: Internal Medicine

## 2021-03-21 DIAGNOSIS — I2510 Atherosclerotic heart disease of native coronary artery without angina pectoris: Secondary | ICD-10-CM

## 2021-03-21 DIAGNOSIS — R931 Abnormal findings on diagnostic imaging of heart and coronary circulation: Secondary | ICD-10-CM

## 2021-03-21 DIAGNOSIS — R079 Chest pain, unspecified: Secondary | ICD-10-CM

## 2021-03-21 SURGERY — LEFT HEART CATH AND CORONARY ANGIOGRAPHY
Anesthesia: Moderate Sedation

## 2021-04-01 ENCOUNTER — Ambulatory Visit: Admission: RE | Admit: 2021-04-01 | Payer: 59 | Source: Home / Self Care | Admitting: Internal Medicine

## 2021-04-01 ENCOUNTER — Encounter: Admission: RE | Payer: Self-pay | Source: Home / Self Care

## 2021-04-01 SURGERY — LEFT HEART CATH AND CORONARY ANGIOGRAPHY
Anesthesia: Moderate Sedation

## 2021-04-15 DIAGNOSIS — Z79899 Other long term (current) drug therapy: Secondary | ICD-10-CM | POA: Diagnosis not present

## 2021-04-15 DIAGNOSIS — L2089 Other atopic dermatitis: Secondary | ICD-10-CM | POA: Diagnosis not present

## 2021-05-20 DIAGNOSIS — R69 Illness, unspecified: Secondary | ICD-10-CM | POA: Diagnosis not present

## 2021-05-20 DIAGNOSIS — I10 Essential (primary) hypertension: Secondary | ICD-10-CM | POA: Diagnosis not present

## 2021-05-20 DIAGNOSIS — E785 Hyperlipidemia, unspecified: Secondary | ICD-10-CM | POA: Diagnosis not present

## 2021-06-03 ENCOUNTER — Other Ambulatory Visit
Admission: RE | Admit: 2021-06-03 | Discharge: 2021-06-03 | Disposition: A | Discharge status: Home / Self Care | Payer: 59 | Source: Ambulatory Visit | Attending: Internal Medicine | Admitting: Internal Medicine

## 2021-06-03 DIAGNOSIS — I10 Essential (primary) hypertension: Secondary | ICD-10-CM | POA: Diagnosis not present

## 2021-06-03 DIAGNOSIS — Z01818 Encounter for other preprocedural examination: Secondary | ICD-10-CM | POA: Diagnosis not present

## 2021-06-03 DIAGNOSIS — I25110 Atherosclerotic heart disease of native coronary artery with unstable angina pectoris: Secondary | ICD-10-CM | POA: Diagnosis not present

## 2021-06-03 DIAGNOSIS — E785 Hyperlipidemia, unspecified: Secondary | ICD-10-CM | POA: Insufficient documentation

## 2021-06-03 DIAGNOSIS — R69 Illness, unspecified: Secondary | ICD-10-CM | POA: Diagnosis not present

## 2021-06-03 LAB — BRAIN NATRIURETIC PEPTIDE: B Natriuretic Peptide: 96 pg/mL (ref 0.0–100.0)

## 2021-06-26 ENCOUNTER — Other Ambulatory Visit: Payer: Self-pay

## 2021-06-26 ENCOUNTER — Encounter: Payer: Self-pay | Admitting: Internal Medicine

## 2021-06-26 ENCOUNTER — Encounter
Admission: RE | Disposition: A | Discharge status: Home / Self Care | Payer: Self-pay | Source: Ambulatory Visit | Attending: Internal Medicine

## 2021-06-26 ENCOUNTER — Ambulatory Visit
Admission: RE | Admit: 2021-06-26 | Discharge: 2021-06-26 | Disposition: A | Discharge status: Home / Self Care | Payer: 59 | Source: Ambulatory Visit | Attending: Internal Medicine | Admitting: Internal Medicine

## 2021-06-26 DIAGNOSIS — I25110 Atherosclerotic heart disease of native coronary artery with unstable angina pectoris: Secondary | ICD-10-CM | POA: Insufficient documentation

## 2021-06-26 DIAGNOSIS — I200 Unstable angina: Secondary | ICD-10-CM

## 2021-06-26 HISTORY — PX: LEFT HEART CATH AND CORONARY ANGIOGRAPHY: CATH118249

## 2021-06-26 LAB — CARDIAC CATHETERIZATION: Cath EF Quantitative: 60 %

## 2021-06-26 SURGERY — LEFT HEART CATH AND CORONARY ANGIOGRAPHY
Anesthesia: Moderate Sedation | Laterality: Left

## 2021-06-26 MED ORDER — FENTANYL CITRATE (PF) 100 MCG/2ML IJ SOLN
INTRAMUSCULAR | Status: DC | PRN
  Administered 2021-06-26 (×2): 25 ug via INTRAVENOUS

## 2021-06-26 MED ORDER — HEPARIN (PORCINE) IN NACL 1000-0.9 UT/500ML-% IV SOLN
INTRAVENOUS | Status: AC
  Filled 2021-06-26: qty 1000

## 2021-06-26 MED ORDER — HEPARIN SODIUM (PORCINE) 1000 UNIT/ML IJ SOLN
INTRAMUSCULAR | Status: AC
  Filled 2021-06-26: qty 10

## 2021-06-26 MED ORDER — ASPIRIN 81 MG PO CHEW
81.0000 mg | CHEWABLE_TABLET | ORAL | Status: AC
  Administered 2021-06-26: 81 mg via ORAL

## 2021-06-26 MED ORDER — SODIUM CHLORIDE 0.9% FLUSH: 3.0000 mL | INTRAVENOUS | Status: DC | PRN

## 2021-06-26 MED ORDER — LIDOCAINE HCL (PF) 1 % IJ SOLN
INTRAMUSCULAR | Status: DC | PRN
  Administered 2021-06-26: 20 mL

## 2021-06-26 MED ORDER — HEPARIN (PORCINE) IN NACL 2000-0.9 UNIT/L-% IV SOLN
INTRAVENOUS | Status: DC | PRN
  Administered 2021-06-26: 1000 mL

## 2021-06-26 MED ORDER — MIDAZOLAM HCL 2 MG/2ML IJ SOLN
INTRAMUSCULAR | Status: AC
  Filled 2021-06-26: qty 2

## 2021-06-26 MED ORDER — IOHEXOL 300 MG/ML  SOLN
INTRAMUSCULAR | Status: DC | PRN
  Administered 2021-06-26: 75 mL

## 2021-06-26 MED ORDER — SODIUM CHLORIDE 0.9 % WEIGHT BASED INFUSION
3.0000 mL/kg/h | INTRAVENOUS | Status: AC
  Administered 2021-06-26: 3 mL/kg/h via INTRAVENOUS

## 2021-06-26 MED ORDER — SODIUM CHLORIDE 0.9% FLUSH: 3.0000 mL | Freq: Two times a day (BID) | INTRAVENOUS | Status: DC

## 2021-06-26 MED ORDER — ASPIRIN 81 MG PO CHEW
CHEWABLE_TABLET | ORAL | Status: AC
  Filled 2021-06-26: qty 1

## 2021-06-26 MED ORDER — VERAPAMIL HCL 2.5 MG/ML IV SOLN
INTRAVENOUS | Status: AC
  Filled 2021-06-26: qty 2

## 2021-06-26 MED ORDER — ISOSORBIDE MONONITRATE ER 30 MG PO TB24: 30.0000 mg | ORAL_TABLET | Freq: Every day | ORAL | 11 refills | Status: AC

## 2021-06-26 MED ORDER — SODIUM CHLORIDE 0.9 % IV SOLN: 250.0000 mL | INTRAVENOUS | Status: DC | PRN

## 2021-06-26 MED ORDER — MIDAZOLAM HCL 2 MG/2ML IJ SOLN
INTRAMUSCULAR | Status: DC | PRN
  Administered 2021-06-26 (×2): 1 mg via INTRAVENOUS

## 2021-06-26 MED ORDER — SODIUM CHLORIDE 0.9 % WEIGHT BASED INFUSION
1.0000 mL/kg/h | INTRAVENOUS | Status: DC
  Administered 2021-06-26: 1 mL/kg/h via INTRAVENOUS

## 2021-06-26 MED ORDER — FENTANYL CITRATE (PF) 100 MCG/2ML IJ SOLN
INTRAMUSCULAR | Status: AC
  Filled 2021-06-26: qty 2

## 2021-06-26 MED ORDER — LIDOCAINE HCL 1 % IJ SOLN
INTRAMUSCULAR | Status: AC
  Filled 2021-06-26: qty 20

## 2021-06-26 SURGICAL SUPPLY — 11 items
CATH INFINITI 5FR MULTPACK ANG (CATHETERS) ×1 IMPLANT
DEVICE CLOSURE MYNXGRIP 5F (Vascular Products) ×1 IMPLANT
DRAPE BRACHIAL (DRAPES) ×1 IMPLANT
NDL PERC 18GX7CM (NEEDLE) IMPLANT
NEEDLE PERC 18GX7CM (NEEDLE) ×2 IMPLANT
PACK CARDIAC CATH (CUSTOM PROCEDURE TRAY) ×2 IMPLANT
PROTECTION STATION PRESSURIZED (MISCELLANEOUS) ×2
SET ATX SIMPLICITY (MISCELLANEOUS) ×1 IMPLANT
SHEATH AVANTI 5FR X 11CM (SHEATH) ×1 IMPLANT
STATION PROTECTION PRESSURIZED (MISCELLANEOUS) IMPLANT
WIRE GUIDERIGHT .035X150 (WIRE) ×1 IMPLANT

## 2021-06-27 ENCOUNTER — Encounter: Payer: Self-pay | Admitting: Internal Medicine

## 2021-07-08 DIAGNOSIS — E785 Hyperlipidemia, unspecified: Secondary | ICD-10-CM | POA: Diagnosis not present

## 2021-07-08 DIAGNOSIS — I10 Essential (primary) hypertension: Secondary | ICD-10-CM | POA: Diagnosis not present

## 2021-07-08 DIAGNOSIS — R69 Illness, unspecified: Secondary | ICD-10-CM | POA: Diagnosis not present

## 2021-07-08 DIAGNOSIS — R079 Chest pain, unspecified: Secondary | ICD-10-CM | POA: Diagnosis not present

## 2021-07-08 DIAGNOSIS — Z720 Tobacco use: Secondary | ICD-10-CM | POA: Diagnosis not present

## 2021-07-08 DIAGNOSIS — I25110 Atherosclerotic heart disease of native coronary artery with unstable angina pectoris: Secondary | ICD-10-CM | POA: Diagnosis not present

## 2021-07-08 DIAGNOSIS — E782 Mixed hyperlipidemia: Secondary | ICD-10-CM | POA: Diagnosis not present

## 2021-07-08 DIAGNOSIS — R000 Tachycardia, unspecified: Secondary | ICD-10-CM | POA: Diagnosis not present

## 2021-09-03 ENCOUNTER — Encounter: Payer: Self-pay | Admitting: Cardiovascular Disease

## 2021-09-03 ENCOUNTER — Ambulatory Visit (INDEPENDENT_AMBULATORY_CARE_PROVIDER_SITE_OTHER): Payer: 59 | Admitting: Cardiovascular Disease

## 2021-09-03 VITALS — BP 127/78 | HR 96 | Ht 76.0 in | Wt 211.2 lb

## 2021-09-03 DIAGNOSIS — Z720 Tobacco use: Secondary | ICD-10-CM

## 2021-09-03 DIAGNOSIS — I25118 Atherosclerotic heart disease of native coronary artery with other forms of angina pectoris: Secondary | ICD-10-CM | POA: Diagnosis not present

## 2021-09-03 DIAGNOSIS — E785 Hyperlipidemia, unspecified: Secondary | ICD-10-CM

## 2021-09-03 DIAGNOSIS — I10 Essential (primary) hypertension: Secondary | ICD-10-CM | POA: Diagnosis not present

## 2021-09-03 NOTE — Progress Notes (Signed)
Cardiology Office Note   Date:  09/03/2021   ID:  [name], DOB [birth year], MRN [id number]  PCP:  [name], MD  Cardiologist: Dr. Juliann Pares  Chief Complaint  Patient presents with   Other    Tachycardiac/CAD. Meds reviewed verbally with pt.      History of Present Illness: Theodore Garcia is a 51 y.o. male who was referred by Dr. Juliann Pares for a second opinion regarding management of ischemic heart disease and whether revascularization is indicated. Patient has known history of essential hypertension, hyperlipidemia, ADHD and tobacco use.  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  As a result, his physical activities has been limited throughout his life and he is overall sedentary.  He smokes 1 pack/day and has been doing so since he was 51 years old.  The patient was evaluated by cardiac CTA due to exertional dyspnea and atypical chest pain.  CTA showed significant mid to distal LAD disease significant by FFR.  Calcium score was 167.  The patient underwent cardiac catheterization which was personally reviewed by me and also I reviewed the images with the patient.  It showed normal LV systolic function, mild RCA stenosis and diffuse moderate mid to distal LAD disease.  Patient was placed on medical therapy and has been doing reasonably well overall.  He has occasional chest pain that can be at rest or with exertion.    Past Medical History:  Diagnosis Date   ADHD (attention deficit hyperactivity disorder)    Anxiety    Asthma    Depression    Eczema    GERD (gastroesophageal reflux disease)    Hyperlipidemia    Hypertension    Osteoporosis    Seasonal allergies     Past Surgical History:  Procedure Laterality Date   BONE GRAFT HIP ILIAC CREST     left hip   craniotomy     for closed fontanella   LEFT HEART CATH AND CORONARY ANGIOGRAPHY Left 06/26/2021   Procedure:  LEFT HEART CATH AND CORONARY ANGIOGRAPHY;  Surgeon: Alwyn Pea, MD;  Location: [facility];  Service: Cardiovascular;  Laterality: Left;   TIBIA IM NAIL INSERTION Right 05/23/2015   Procedure: INTRAMEDULLARY (IM) NAIL TIBIAL;  Surgeon: Deeann Saint, MD;  Location: [facility] ORS;  Service: Orthopedics;  Laterality: Right;   TOTAL HIP ARTHROPLASTY     Left hip   TOTAL HIP ARTHROPLASTY Right 11/08/2019   Procedure: TOTAL HIP ARTHROPLASTY ANTERIOR APPROACH;  Surgeon: Kennedy Bucker, MD;  Location: [facility] ORS;  Service: Orthopedics;  Laterality: Right;   WISDOM TOOTH EXTRACTION     WRIST FRACTURE SURGERY     Right wrist     Current Outpatient Medications  Medication Sig Dispense Refill   albuterol (VENTOLIN HFA) 108 (90 Base) MCG/ACT inhaler Inhale 2 puffs into the lungs every 6 (six) hours as needed for wheezing or shortness of breath.     ALPRAZolam (XANAX) 0.5 MG tablet Take 0.5 mg by mouth 3 (three) times daily as needed for anxiety or sleep.     amphetamine-dextroamphetamine (ADDERALL XR) 25 MG 24 hr capsule Take 25 mg by mouth every morning.     amphetamine-dextroamphetamine (ADDERALL) 10 MG tablet Take 10 mg by mouth daily as needed (depends on work schedule).     aspirin EC 81 MG tablet Take 81 mg by mouth daily. Swallow whole.     clopidogrel (PLAVIX) 75 MG tablet  Take 75 mg by mouth daily.     Dupilumab (DUPIXENT) 300 MG/2ML SOPN Inject 300 mg into the skin every 14 (fourteen) days.     EPINEPHrine 0.3 mg/0.3 mL IJ SOAJ injection Inject 0.3 mg into the muscle once as needed (for severe allergic reaction).      fenofibrate (TRICOR) 48 MG tablet Take 48 mg by mouth daily.     isosorbide mononitrate (IMDUR) 30 MG 24 hr tablet Take 1 tablet (30 mg total) by mouth daily. 30 tablet 11   lisinopril (ZESTRIL) 20 MG tablet Take 20 mg by mouth daily.     metoprolol succinate (TOPROL-XL) 25 MG 24 hr tablet Take 25 mg by mouth daily.     omega-3 acid ethyl esters (LOVAZA) 1 g capsule Take  2 g by mouth daily.     pimecrolimus (ELIDEL) 1 % cream Apply 1 application topically 2 (two) times daily as needed (eczema).     ranolazine (RANEXA) 500 MG 12 hr tablet Take 500 mg by mouth daily.     rosuvastatin (CRESTOR) 40 MG tablet Take 40 mg by mouth daily.     No current facility-administered medications for this visit.    Allergies:   Cefazolin, Clindamycin hcl, and Penicillins    Social History:  The patient  reports that he has been smoking cigarettes. He has a 39.00 pack-year smoking history. He has never used smokeless tobacco. He reports current alcohol use. He reports that he does not currently use drugs after having used the following drugs: Marijuana.   Family History:  The patient's family history includes CAD in his mother; Heart Problems in his father; Heart disease in his mother; Heart murmur in his father; Prostate cancer in his maternal grandfather.    ROS:  Please see the history of present illness.   Otherwise, review of systems are positive for none.   All other systems are reviewed and negative.    PHYSICAL EXAM: VS:  BP 127/78 (BP Location: Right Arm, Patient Position: Sitting, Cuff Size: Normal)   Pulse 96   Ht 6\' 4"  (1.93 m)   Wt 211 lb 4 oz (95.8 kg)   SpO2 97%   BMI 25.71 kg/m  , BMI Body mass index is 25.71 kg/m. GEN: Well nourished, well developed, in no acute distress  HEENT: normal  Neck: no JVD, carotid bruits, or masses Cardiac: RRR; no murmurs, rubs, or gallops,no edema  Respiratory:  clear to auscultation bilaterally, normal work of breathing GI: soft, nontender, nondistended, + BS MS: no deformity or atrophy  Skin: warm and dry, no rash Neuro:  Strength and sensation are intact Psych: euthymic mood, full affect   EKG:  EKG is ordered today. The ekg ordered today demonstrates normal sinus rhythm with possible left atrial enlargement.   Recent Labs: 03/04/2021: Creatinine, Ser 1.00 06/03/2021: B Natriuretic Peptide 96.0    Lipid  Panel No results found for: "CHOL", "TRIG", "HDL", "CHOLHDL", "VLDL", "LDLCALC", "LDLDIRECT"    Wt Readings from Last 3 Encounters:  09/03/21 211 lb 4 oz (95.8 kg)  06/26/21 200 lb (90.7 kg)  03/04/21 200 lb (90.7 kg)          09/03/2021    2:02 PM  PAD Screen  Previous PAD dx? No  Previous surgical procedure? No  Pain with walking? No  Feet/toe relief with dangling? No  Painful, non-healing ulcers? No  Extremities discolored? No      ASSESSMENT AND PLAN:  1.  Coronary artery disease involving native coronary  arteries with other forms of angina: I personally reviewed the cardiac catheterization images and I agree that coronary revascularization is not indicated as the LAD disease appears to be moderate.  It was significant by CT FFR likely due to distal location and diffuse disease.  I recommend continuing aggressive medical therapy and risk factor modifications.  2.  Tobacco use: I had a prolonged discussion with him about the importance of smoking cessation.  3.  Hyperlipidemia: I agree with rosuvastatin with a target LDL of less than 70.  4.  Essential hypertension: Blood pressure is controlled.  5.  Physical inactivity: I discussed with him the importance of starting an exercise programs.  He is limited by arthritis and previous bilateral hip replacement but I asked him to consider water aerobics.    Disposition:   Continue regular follow-up with Dr. Juliann Pares.    Signed,  Lorine Bears, MD  09/03/2021 2:14 PM    [facility]

## 2021-09-03 NOTE — Patient Instructions (Signed)
Medication Instructions:  Your physician recommends that you continue on your current medications as directed. Please refer to the Current Medication list given to you today.  *If you need a refill on your cardiac medications before your next appointment, please call your pharmacy*   Lab Work: None ordered If you have labs (blood work) drawn today and your tests are completely normal, you will receive your results only by: MyChart Message (if you have MyChart) OR A paper copy in the mail If you have any lab test that is abnormal or we need to change your treatment, we will call you to review the results.   Testing/Procedures: None ordered   Follow-Up: At [facility], you and your health needs are our priority.  As part of our continuing mission to provide you with exceptional heart care, we have created designated Provider Care Teams.  These Care Teams include your primary Cardiologist (physician) and Advanced Practice Providers (APPs -  Physician Assistants and Nurse Practitioners) who all work together to provide you with the care you need, when you need it.  We recommend signing up for the patient portal called "MyChart".  Sign up information is provided on this After Visit Summary.  MyChart is used to connect with patients for Virtual Visits (Telemedicine).  Patients are able to view lab/test results, encounter notes, upcoming appointments, etc.  Non-urgent messages can be sent to your provider as well.   To learn more about what you can do with MyChart, go to [email or web address].    Your next appointment:   As needed  The format for your next appointment:   In Person  Provider:   You may see Theodore Bears, MD or one of the following Advanced Practice Providers on your designated Care Team:   Nicolasa Ducking, NP Eula Listen, PA-C Cadence Fransico Michael, [place]   Other Instructions   Steps to Quit Smoking Smoking tobacco is the leading cause of preventable death. It can  affect almost every organ in the body. Smoking puts you and people around you at risk for many serious, long-lasting (chronic) diseases. Quitting smoking can be hard, but it is one of the best things that you can do for your health. It is never too late to quit. Do not give up if you cannot quit the first time. Some people need to try many times to quit. Do your best to stick to your quit plan, and talk with your doctor if you have any questions or concerns. How do I get ready to quit? Pick a date to quit. Set a date within the next 2 weeks to give you time to prepare. Write down the reasons why you are quitting. Keep this list in places where you will see it often. Tell your family, friends, and co-workers that you are quitting. Their support is important. Talk with your doctor about the choices that may help you quit. Find out if your health insurance will pay for these treatments. Know the people, places, things, and activities that make you want to smoke (triggers). Avoid them. What first steps can I take to quit smoking? Throw away all cigarettes at home, at work, and in your car. Throw away the things that you use when you smoke, such as ashtrays and lighters. Clean your car. Empty the ashtray. Clean your home, including curtains and carpets. What can I do to help me quit smoking? Talk with your doctor about taking medicines and seeing a counselor. You are more likely to succeed when you  do both. If you are pregnant or breastfeeding: Talk with your doctor about counseling or other ways to quit smoking. Do not take medicine to help you quit smoking unless your doctor tells you to. Quit right away Quit smoking completely, instead of slowly cutting back on how much you smoke over a period of time. Stopping smoking right away may be more successful than slowly quitting. Go to counseling. In-person is best if this is an option. You are more likely to quit if you go to counseling sessions  regularly. Take medicine You may take medicines to help you quit. Some medicines need a prescription, and some you can buy over-the-counter. Some medicines may contain a drug called nicotine to replace the nicotine in cigarettes. Medicines may: Help you stop having the desire to smoke (cravings). Help to stop the problems that come when you stop smoking (withdrawal symptoms). Your doctor may ask you to use: Nicotine patches, gum, or lozenges. Nicotine inhalers or sprays. Non-nicotine medicine that you take by mouth. Find resources Find resources and other ways to help you quit smoking and remain smoke-free after you quit. They include: Online chats with a Veterinary surgeon. Phone quitlines. Printed Materials engineer. Support groups or group counseling. Text messaging programs. Mobile phone apps. Use apps on your mobile phone or tablet that can help you stick to your quit plan. Examples of free services include Quit Guide from the CDC and smokefree.gov  What can I do to make it easier to quit?  Talk to your family and friends. Ask them to support and encourage you. Call a phone quitline, such as 1-800-QUIT-NOW, reach out to support groups, or work with a Veterinary surgeon. Ask people who smoke to not smoke around you. Avoid places that make you want to smoke, such as: Bars. Parties. Smoke-break areas at work. Spend time with people who do not smoke. Lower the stress in your life. Stress can make you want to smoke. Try these things to lower stress: Getting regular exercise. Doing deep-breathing exercises. Doing yoga. Meditating. What benefits will I see if I quit smoking? Over time, you may have: A better sense of smell and taste. Less coughing and sore throat. A slower heart rate. Lower blood pressure. Clearer skin. Better breathing. Fewer sick days. Summary Quitting smoking can be hard, but it is one of the best things that you can do for your health. Do not give up if you cannot quit  the first time. Some people need to try many times to quit. When you decide to quit smoking, make a plan to help you succeed. Quit smoking right away, not slowly over a period of time. When you start quitting, get help and support to keep you smoke-free. This information is not intended to replace advice given to you by your health care provider. Make sure you discuss any questions you have with your health care provider. Document Revised: 02/01/2021 Document Reviewed: 02/01/2021 Elsevier Patient Education  2023 Elsevier Inc.   Important Information About Sugar

## 2022-06-29 IMAGING — CT CT HEART MORP W/ CTA COR W/ SCORE W/ CA W/CM &/OR W/O CM
1 of 14 series · 3 of 20 positions shown, 4 images · non-contrast
Comparison: 05/23/2015 chest radiograph.

Addendum:
CLINICAL DATA: Chest pain

EXAM:
Cardiac/Coronary  CTA
TECHNIQUE: The patient was scanned on a Siemens Somatom go.Top scanner.

[Series 26: multiphase % cta coronary 0.60 · axial · 0.38mm/px · z∈[-1149,-1080]mm · 3 of 3773 slices shown, 4 images]
[im 944/3773  vessel]
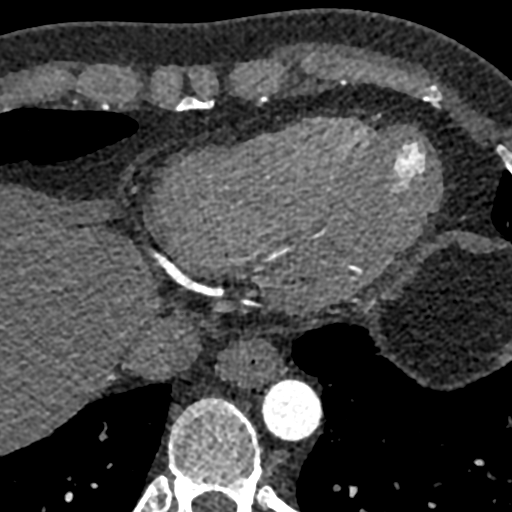
[im 944/3773  lung]
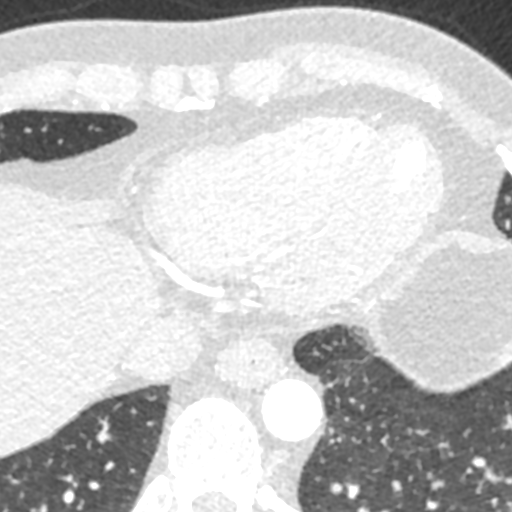
[im 1887/3773  vessel]
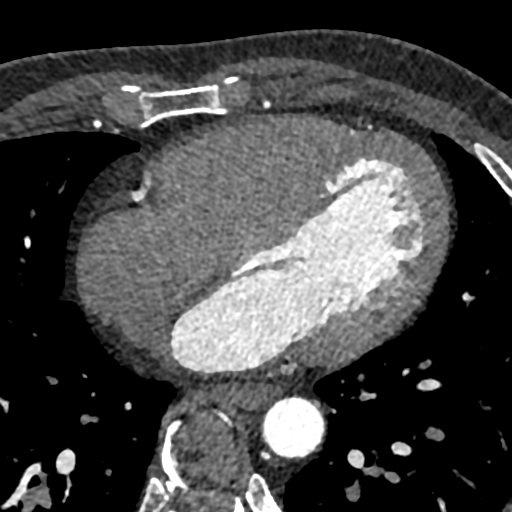
[im 2830/3773  vessel]
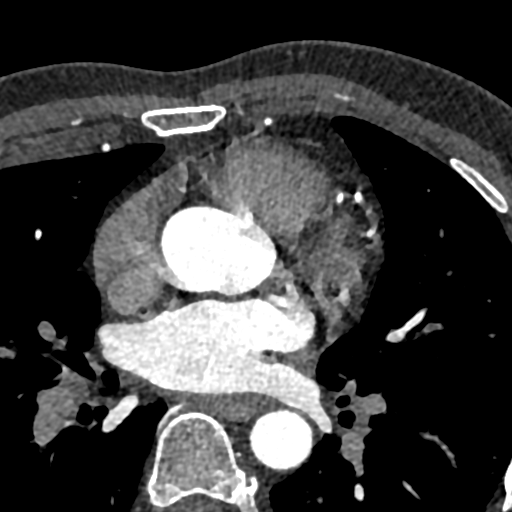

[3 of 20 positions shown; findings below may reference images not displayed]

:
A retrospective scan was triggered in the descending thoracic aorta.
Axial non-contrast 3 mm slices were carried out through the heart.
The data set was analyzed on a dedicated work station and scored
using the Agatson method. Gantry rotation speed was 330 msecs and
collimation was .6 mm. 100mg of metoprolol, 10mg of ivabradine and
0.8 mg of sl NTG was given. The 3D data set was reconstructed in 5%
intervals of the 60-95 % of the R-R cycle. Diastolic phases were
analyzed on a dedicated work station using MPR, MIP and VRT modes.
The patient received 75 cc of contrast.
FINDINGS: Aorta:  Normal size.  No calcifications.  No dissection.

Aortic Valve:  Trileaflet.  No calcifications.

Coronary Arteries:  Normal coronary origin.  Right dominance.

RCA is a large dominant artery that gives rise to PDA and PLA. There
is calcified plaque causing minimal stenosis (<25%) proximally and
mild stenosis distally (25%).

Left main is a large artery that gives rise to ramus, LAD and LCX
arteries. There is no LM disease.

There is calcified plaque in the ostial Ramus branch causing severe
stenosis (>70%)

LAD has calcified plaque causing moderate mid lad disease, it
branches into a diagonal branch. There is calcified plaque causing
moderate ostial first diagonal disease (50%).

LCX is a non-dominant artery that gives rise to a small OM1 branch.
There is mild calcified plaque in the proximal LCx causing mild
stenosis (25%).

Other findings:

Normal pulmonary vein drainage into the left atrium.

Normal left atrial appendage without a thrombus.

Normal size of the pulmonary artery.
IMPRESSION: 1. Coronary calcium score of 167. This was 90th percentile for age
and sex matched control.

2. Normal coronary origin with right dominance.

3. Calcified plaque causing severe ostial Ramus disease.

4. Calcified plaque causing moderate mid LAD and ostial diagonal
disease

5. Mild distal RCA and proximal LCx stenosis

6. CAD-RADS 4 Severe stenosis. (70-99% or > 50% left main). Cardiac
catheterization is recommended.

7. Additional analysis with CT FFR will be submitted and reported
separately. Consider symptom-guided anti-ischemic pharmacotherapy as
well as risk factor modification per guideline directed care.

EXAM:
OVER-READ INTERPRETATION  CT CHEST

The following report is an over-read performed by radiologist Dr.
Lakia Harr [REDACTED] on 03/04/2021. This over-read
does not include interpretation of cardiac or coronary anatomy or
pathology. The coronary CTA interpretation by the cardiologist is
attached.
FINDINGS: Vascular: Normal aortic caliber. No central pulmonary embolism, on
this non-dedicated study.

Mediastinum/Nodes: No imaged thoracic adenopathy.

Lungs/Pleura: No pleural fluid. 2 mm left lower lobe pulmonary
nodule on 34/10. 2 mm right middle lobe pulmonary nodule on [DATE].

Upper Abdomen: Normal imaged portions of the liver, spleen, stomach.

Musculoskeletal: No acute osseous abnormality.
IMPRESSION: 1.  No acute findings in the imaged extracardiac chest.
2. Bilateral tiny pulmonary nodules. No follow-up needed if patient
is low-risk. Non-contrast chest CT can be considered in 12 months if
patient is high-risk. This recommendation follows the consensus
statement: Guidelines for Management of Incidental Pulmonary Nodules
Detected on CT Images: From the [HOSPITAL] 4135; Radiology
4135; [DATE].

*** End of Addendum ***
:
A retrospective scan was triggered in the descending thoracic aorta.
Axial non-contrast 3 mm slices were carried out through the heart.
The data set was analyzed on a dedicated work station and scored
using the Agatson method. Gantry rotation speed was 330 msecs and
collimation was .6 mm. 100mg of metoprolol, 10mg of ivabradine and
0.8 mg of sl NTG was given. The 3D data set was reconstructed in 5%
intervals of the 60-95 % of the R-R cycle. Diastolic phases were
analyzed on a dedicated work station using MPR, MIP and VRT modes.
The patient received 75 cc of contrast.
FINDINGS: Aorta:  Normal size.  No calcifications.  No dissection.

Aortic Valve:  Trileaflet.  No calcifications.

Coronary Arteries:  Normal coronary origin.  Right dominance.

RCA is a large dominant artery that gives rise to PDA and PLA. There
is calcified plaque causing minimal stenosis (<25%) proximally and
mild stenosis distally (25%).

Left main is a large artery that gives rise to ramus, LAD and LCX
arteries. There is no LM disease.

There is calcified plaque in the ostial Ramus branch causing severe
stenosis (>70%)

LAD has calcified plaque causing moderate mid lad disease, it
branches into a diagonal branch. There is calcified plaque causing
moderate ostial first diagonal disease (50%).

LCX is a non-dominant artery that gives rise to a small OM1 branch.
There is mild calcified plaque in the proximal LCx causing mild
stenosis (25%).

Other findings:

Normal pulmonary vein drainage into the left atrium.

Normal left atrial appendage without a thrombus.

Normal size of the pulmonary artery.
IMPRESSION: 1. Coronary calcium score of 167. This was 90th percentile for age
and sex matched control.

2. Normal coronary origin with right dominance.

3. Calcified plaque causing severe ostial Ramus disease.

4. Calcified plaque causing moderate mid LAD and ostial diagonal
disease

5. Mild distal RCA and proximal LCx stenosis

6. CAD-RADS 4 Severe stenosis. (70-99% or > 50% left main). Cardiac
catheterization is recommended.

7. Additional analysis with CT FFR will be submitted and reported
separately. Consider symptom-guided anti-ischemic pharmacotherapy as
well as risk factor modification per guideline directed care.

## 2022-12-03 ENCOUNTER — Encounter: Payer: Self-pay | Admitting: Internal Medicine

## 2022-12-10 ENCOUNTER — Ambulatory Visit: Payer: 59 | Admitting: Anesthesiology

## 2022-12-10 ENCOUNTER — Encounter
Admission: RE | Disposition: A | Discharge status: Home / Self Care | Payer: Self-pay | Source: Home / Self Care | Attending: Internal Medicine

## 2022-12-10 ENCOUNTER — Ambulatory Visit
Admission: RE | Admit: 2022-12-10 | Discharge: 2022-12-10 | Disposition: A | Discharge status: Home / Self Care | Payer: 59 | Attending: Internal Medicine | Admitting: Internal Medicine

## 2022-12-10 DIAGNOSIS — Z1211 Encounter for screening for malignant neoplasm of colon: Secondary | ICD-10-CM | POA: Diagnosis present

## 2022-12-10 DIAGNOSIS — I10 Essential (primary) hypertension: Secondary | ICD-10-CM | POA: Diagnosis not present

## 2022-12-10 DIAGNOSIS — K640 First degree hemorrhoids: Secondary | ICD-10-CM | POA: Insufficient documentation

## 2022-12-10 DIAGNOSIS — Z09 Encounter for follow-up examination after completed treatment for conditions other than malignant neoplasm: Secondary | ICD-10-CM | POA: Insufficient documentation

## 2022-12-10 DIAGNOSIS — J45909 Unspecified asthma, uncomplicated: Secondary | ICD-10-CM | POA: Diagnosis not present

## 2022-12-10 DIAGNOSIS — F909 Attention-deficit hyperactivity disorder, unspecified type: Secondary | ICD-10-CM | POA: Diagnosis not present

## 2022-12-10 DIAGNOSIS — Z79899 Other long term (current) drug therapy: Secondary | ICD-10-CM | POA: Insufficient documentation

## 2022-12-10 DIAGNOSIS — K5730 Diverticulosis of large intestine without perforation or abscess without bleeding: Secondary | ICD-10-CM | POA: Insufficient documentation

## 2022-12-10 DIAGNOSIS — Z860101 Personal history of adenomatous and serrated colon polyps: Secondary | ICD-10-CM | POA: Insufficient documentation

## 2022-12-10 DIAGNOSIS — K219 Gastro-esophageal reflux disease without esophagitis: Secondary | ICD-10-CM | POA: Diagnosis not present

## 2022-12-10 DIAGNOSIS — K641 Second degree hemorrhoids: Secondary | ICD-10-CM | POA: Insufficient documentation

## 2022-12-10 DIAGNOSIS — D128 Benign neoplasm of rectum: Secondary | ICD-10-CM | POA: Insufficient documentation

## 2022-12-10 DIAGNOSIS — F17210 Nicotine dependence, cigarettes, uncomplicated: Secondary | ICD-10-CM | POA: Insufficient documentation

## 2022-12-10 DIAGNOSIS — K625 Hemorrhage of anus and rectum: Secondary | ICD-10-CM | POA: Diagnosis not present

## 2022-12-10 HISTORY — PX: COLONOSCOPY WITH PROPOFOL: SHX5780

## 2022-12-10 HISTORY — PX: POLYPECTOMY: SHX5525

## 2022-12-10 SURGERY — COLONOSCOPY WITH PROPOFOL
Anesthesia: General

## 2022-12-10 MED ORDER — PROPOFOL 10 MG/ML IV BOLUS
INTRAVENOUS | Status: DC | PRN
Start: 2022-12-10 — End: 2022-12-10
  Administered 2022-12-10: 30 mg via INTRAVENOUS
  Administered 2022-12-10: 100 mg via INTRAVENOUS

## 2022-12-10 MED ORDER — DEXMEDETOMIDINE HCL IN NACL 80 MCG/20ML IV SOLN
INTRAVENOUS | Status: DC | PRN
Start: 2022-12-10 — End: 2022-12-10
  Administered 2022-12-10: 20 ug via INTRAVENOUS

## 2022-12-10 MED ORDER — SODIUM CHLORIDE 0.9 % IV SOLN
INTRAVENOUS | Status: DC
  Administered 2022-12-10: 20 mL/h via INTRAVENOUS

## 2022-12-10 MED ORDER — PROPOFOL 500 MG/50ML IV EMUL
INTRAVENOUS | Status: DC | PRN
  Administered 2022-12-10: 150 ug/kg/min via INTRAVENOUS

## 2022-12-10 MED ORDER — LIDOCAINE HCL (CARDIAC) PF 100 MG/5ML IV SOSY
PREFILLED_SYRINGE | INTRAVENOUS | Status: DC | PRN
  Administered 2022-12-10: 50 mg via INTRAVENOUS

## 2022-12-10 NOTE — Interval H&P Note (Signed)
History and Physical Interval Note:  12/10/2022 12:27 PM  Theodore Garcia  has presented today for surgery, with the diagnosis of V12.72 (ICD-9-CM) - Z86.010 (ICD-10-CM) - Hx of adenomatous polyp of colon.  The various methods of treatment have been discussed with the patient and family. After consideration of risks, benefits and other options for treatment, the patient has consented to  Procedure(s): COLONOSCOPY WITH PROPOFOL (N/A) as a surgical intervention.  The patient's history has been reviewed, patient examined, no change in status, stable for surgery.  I have reviewed the patient's chart and labs.  Questions were answered to the patient's satisfaction.     [place], [place]

## 2022-12-10 NOTE — Transfer of Care (Signed)
Immediate Anesthesia Transfer of Care Note  Patient: LACOREY BRUSCA  Procedure(s) Performed: COLONOSCOPY WITH PROPOFOL POLYPECTOMY  Patient Location: PACU  Anesthesia Type:General  Level of Consciousness: drowsy and patient cooperative  Airway & Oxygen Therapy: Patient Spontanous Breathing  Post-op Assessment: Report given to RN and Post -op Vital signs reviewed and stable  Post vital signs: Reviewed and stable  Last Vitals:  Vitals Value Taken Time  BP 96/58 12/10/22 1303  Temp    Pulse 83 12/10/22 1303  Resp 15 12/10/22 1303  SpO2 95 % 12/10/22 1303  Vitals shown include unfiled device data.  Last Pain:  Vitals:   12/10/22 1140  TempSrc: Temporal  PainSc: 4          Complications: No notable events documented.

## 2022-12-10 NOTE — Anesthesia Preprocedure Evaluation (Signed)
Anesthesia Evaluation  Patient identified by MRN, date of birth, ID band Patient awake    Reviewed: Allergy & Precautions, H&P , NPO status , Patient's Chart, lab work & pertinent test results, reviewed documented beta blocker date and time   Airway Mallampati: II  TM Distance: >3 FB Neck ROM: full    Dental  (+) Teeth Intact   Pulmonary asthma , Current Smoker   Pulmonary exam normal        Cardiovascular Exercise Tolerance: Good hypertension, On Medications negative cardio ROS Normal cardiovascular exam Rhythm:regular Rate:Normal     Neuro/Psych  PSYCHIATRIC DISORDERS Anxiety Depression    negative neurological ROS     GI/Hepatic Neg liver ROS,GERD  Medicated,,  Endo/Other  negative endocrine ROS    Renal/GU      Musculoskeletal   Abdominal   Peds  Hematology negative hematology ROS (+)   Anesthesia Other Findings Past Medical History: No date: ADHD (attention deficit hyperactivity disorder) No date: Anxiety No date: Asthma No date: Depression No date: Eczema No date: GERD (gastroesophageal reflux disease) No date: Hyperlipidemia No date: Hypertension No date: Osteoporosis No date: Seasonal allergies Past Surgical History: No date: BONE GRAFT HIP ILIAC CREST     Comment:  left hip No date: craniotomy     Comment:  for closed fontanella 05/23/2015: TIBIA IM NAIL INSERTION; Right     Comment:  Procedure: INTRAMEDULLARY (IM) NAIL TIBIAL;  Surgeon:               Deeann Saint, MD;  Location: [facility] ORS;  Service:               Orthopedics;  Laterality: Right; No date: TOTAL HIP ARTHROPLASTY     Comment:  Left hip No date: WISDOM TOOTH EXTRACTION No date: WRIST FRACTURE SURGERY     Comment:  Right wrist   Reproductive/Obstetrics negative OB ROS                             Anesthesia Physical Anesthesia Plan  ASA: 2  Anesthesia Plan: General   Post-op Pain Management:  Minimal or no pain anticipated   Induction: Intravenous  PONV Risk Score and Plan: 3 and Propofol infusion, TIVA and Ondansetron  Airway Management Planned: Nasal Cannula  Additional Equipment: None  Intra-op Plan:   Post-operative Plan:   Informed Consent: I have reviewed the patients History and Physical, chart, labs and discussed the procedure including the risks, benefits and alternatives for the proposed anesthesia with the patient or authorized representative who has indicated his/her understanding and acceptance.     Dental advisory given  Plan Discussed with: CRNA and Surgeon  Anesthesia Plan Comments: (Discussed risks of anesthesia with patient, including possibility of difficulty with spontaneous ventilation under anesthesia necessitating airway intervention, PONV, and rare risks such as cardiac or respiratory or neurological events, and allergic reactions. Discussed the role of CRNA in patient's perioperative care. Patient understands.)       Anesthesia Quick Evaluation

## 2022-12-10 NOTE — Op Note (Signed)
[facility]astroenterology Patient Name: Theodore Garcia Procedure Date: 12/10/2022 12:21 PM MRN: [id number] Account #: [id number] Date of Birth: Apr 07, 1970 Admit Type: Outpatient Age: 52 Room: [facility] ENDO ROOM 2 Gender: Male Note Status: Finalized Instrument Name: Nelda Marseille [id number] Procedure:             Colonoscopy Indications:           High risk colon cancer surveillance: Personal history                         of non-advanced adenoma Providers:             Boykin Nearing. Ryon Layton MD, MD Medicines:             Propofol per Anesthesia Complications:         No immediate complications. Estimated blood loss:                         Minimal. Procedure:             Pre-Anesthesia Assessment:                        - The risks and benefits of the procedure and the                         sedation options and risks were discussed with the                         patient. All questions were answered and informed                         consent was obtained.                        - Patient identification and proposed procedure were                         verified prior to the procedure by the nurse. The                         procedure was verified in the procedure room.                        - ASA Grade Assessment: III - A patient with severe                         systemic disease.                        - After reviewing the risks and benefits, the patient                         was deemed in satisfactory condition to undergo the                         procedure.                        After obtaining informed consent, the colonoscope was  passed under direct vision. Throughout the procedure,                         the patient's blood pressure, pulse, and oxygen                         saturations were monitored continuously. The                         Colonoscope was introduced through the anus and                         advanced to  the the cecum, identified by appendiceal                         orifice and ileocecal valve. The colonoscopy was                         performed without difficulty. The patient tolerated                         the procedure well. The quality of the bowel                         preparation was adequate. The ileocecal valve,                         appendiceal orifice, and rectum were photographed. Findings:      The perianal exam findings include internal hemorrhoids that prolapse       with straining, but spontaneously regress to the resting position (Grade       II).      Non-bleeding internal hemorrhoids were found during retroflexion. The       hemorrhoids were Grade I (internal hemorrhoids that do not prolapse).      An 8 mm polyp was found in the rectum. The polyp was sessile. The polyp       was removed with a cold snare. Resection and retrieval were complete.      Many medium-mouthed diverticula were found in the sigmoid colon.      The exam was otherwise without abnormality. Impression:            - Internal hemorrhoids that prolapse with straining,                         but spontaneously regress to the resting position                         (Grade II) found on perianal exam.                        - Non-bleeding internal hemorrhoids.                        - One 8 mm polyp in the rectum, removed with a cold                         snare. Resected and retrieved.                        -  Diverticulosis in the sigmoid colon.                        - The examination was otherwise normal. Recommendation:        - Patient has a contact number available for                         emergencies. The signs and symptoms of potential                         delayed complications were discussed with the patient.                         Return to normal activities tomorrow. Written                         discharge instructions were provided to the patient.                        -  Resume previous diet.                        - Continue present medications.                        - Repeat colonoscopy is recommended for surveillance.                         The colonoscopy date will be determined after                         pathology results from today's exam become available                         for review.                        - Return to GI office PRN.                        - The findings and recommendations were discussed with                         the patient. Procedure Code(s):     --- Professional ---                        [phone], Colonoscopy, flexible; with removal of                         tumor(s), polyp(s), or other lesion(s) by snare                         technique Diagnosis Code(s):     --- Professional ---                        K57.30, Diverticulosis of large intestine without                         perforation or abscess without bleeding  K64.1, Second degree hemorrhoids                        D12.8, Benign neoplasm of rectum                        Z86.010, Personal history of colonic polyps CPT copyright 2022 American Medical Association. All rights reserved. The codes documented in this report are preliminary and upon coder review may  be revised to meet current compliance requirements. Stanton Kidney MD, MD 12/10/2022 1:06:26 PM This report has been signed electronically. Number of Addenda: 0 Note Initiated On: 12/10/2022 12:21 PM Scope Withdrawal Time: 0 hours 5 minutes 52 seconds  Total Procedure Duration: 0 hours 11 minutes 46 seconds  Estimated Blood Loss:  Estimated blood loss was minimal. Estimated blood loss                         was minimal. Estimated blood loss was minimal.      [facility]

## 2022-12-10 NOTE — H&P (Signed)
Outpatient short stay form Pre-procedure 12/10/2022 12:25 PM Theodore Devera K. Theodore Garcia, M.D.  Primary Physician: [name], MD  Reason for visit: Personal history of adenomatous colon polyps  History of present illness:  [name] presents to the [facility] GI clinic for chief complaint of high-risk colon cancer surveillance 2/2 personal history of adenomatous polyp of colon. He had colonoscopy performed 06/2017 by Dr. Norma Garcia for indications of rectal bleeding which showed one 16 mm polyp removed piecemeal from rectum with complete resection with path showing tubular adenoma. A 5-year repeat was advised. He presents to the clinic today with his mother. He reports that he noticed a bump around his anus about 6 weeks ago that was very painful and irritated for about a week and then symptoms went away. He believes it is a hemorrhoid. He has never had one before so he is concerned. He reports right before he noticed symptoms he did have a hard to pass bowel movement with a lot of straining. He denies any rectal discharge, rectal pain, rectal bleeding, burning, or itching. He reports his bowel habits are fairly regular and usually goes 1-2 times per day. Sometimes he can have straining, but he has been trying to watch this recently. He denies any hematochezia, melena, abdominal pain, abdominal cramping, fecal urgency, or fecal incontinence. Appetite and diet are stable without any unintentional weight loss. He denies any UGI symptoms such as heartburn, acid reflux, esophageal dysphagia, odynophagia, early satiety, hoarseness, or epigastric abdominal pain     Current Facility-Administered Medications:    0.9 %  sodium chloride infusion, , Intravenous, Continuous, Latroya Ng, Boykin Nearing, MD, Last Rate: 20 mL/hr at 12/10/22 1200, 20 mL/hr at 12/10/22 1200  Medications Prior to Admission  Medication Sig Dispense Refill Last Dose   albuterol (VENTOLIN HFA) 108 (90 Base) MCG/ACT inhaler Inhale 2 puffs into the lungs  every 6 (six) hours as needed for wheezing or shortness of breath.   Past Week   ALPRAZolam (XANAX) 0.5 MG tablet Take 0.5 mg by mouth 3 (three) times daily as needed for anxiety or sleep.   Past Week   amphetamine-dextroamphetamine (ADDERALL XR) 25 MG 24 hr capsule Take 25 mg by mouth every morning.   Past Week   amphetamine-dextroamphetamine (ADDERALL) 10 MG tablet Take 10 mg by mouth daily as needed (depends on work schedule).   Past Week   aspirin EC 81 MG tablet Take 81 mg by mouth daily. Swallow whole.   Past Week   clopidogrel (PLAVIX) 75 MG tablet Take 75 mg by mouth daily.   Past Week   Dupilumab (DUPIXENT) 300 MG/2ML SOPN Inject 300 mg into the skin every 14 (fourteen) days.   Past Week   EPINEPHrine 0.3 mg/0.3 mL IJ SOAJ injection Inject 0.3 mg into the muscle once as needed (for severe allergic reaction).    Past Month   fenofibrate (TRICOR) 48 MG tablet Take 48 mg by mouth daily.   Past Week   lisinopril (ZESTRIL) 20 MG tablet Take 20 mg by mouth daily.   Past Week   metoprolol succinate (TOPROL-XL) 25 MG 24 hr tablet Take 25 mg by mouth daily.   Past Week   omega-3 acid ethyl esters (LOVAZA) 1 g capsule Take 2 g by mouth daily.   Past Week   pimecrolimus (ELIDEL) 1 % cream Apply 1 application topically 2 (two) times daily as needed (eczema).   Past Week   ranolazine (RANEXA) 500 MG 12 hr tablet Take 500 mg by mouth daily.  Past Week   rosuvastatin (CRESTOR) 40 MG tablet Take 40 mg by mouth daily.   Past Week   isosorbide mononitrate (IMDUR) 30 MG 24 hr tablet Take 1 tablet (30 mg total) by mouth daily. 30 tablet 11      Allergies  Allergen Reactions   Cefazolin Anaphylaxis   Clindamycin Hcl Anaphylaxis    Throat closing/swelling/difficulty breathing   Penicillins Swelling, Rash and Other (See Comments)    Reaction:  Tongue swelling  Has patient had a PCN reaction causing immediate rash, facial/tongue/throat swelling, SOB or lightheadedness with hypotension: Yes Has patient  had a PCN reaction causing severe rash involving mucus membranes or skin necrosis: No Has patient had a PCN reaction that required hospitalization No Has patient had a PCN reaction occurring within the last 10 years: No If all of the above answers are "NO", then may proceed with Cephalosporin use.     Past Medical History:  Diagnosis Date   ADHD (attention deficit hyperactivity disorder)    Anxiety    Asthma    Depression    Eczema    GERD (gastroesophageal reflux disease)    Hyperlipidemia    Hypertension    Osteoporosis    Seasonal allergies     Review of systems:  Otherwise negative.    Physical Exam  Gen: Alert, oriented. Appears stated age.  HEENT: [place]/AT. PERRLA. Lungs: CTA, no wheezes. CV: RR nl S1, S2. Abd: soft, benign, no masses. BS+ Ext: No edema. Pulses 2+    Planned procedures: Proceed with colonoscopy. The patient understands the nature of the planned procedure, indications, risks, alternatives and potential complications including but not limited to bleeding, infection, perforation, damage to internal organs and possible oversedation/side effects from anesthesia. The patient agrees and gives consent to proceed.  Please refer to procedure notes for findings, recommendations and patient disposition/instructions.     [name] Theodore Garcia, M.D. Gastroenterology 12/10/2022  12:25 PM

## 2022-12-11 ENCOUNTER — Encounter: Payer: Self-pay | Admitting: Internal Medicine

## 2022-12-11 LAB — SURGICAL PATHOLOGY

## 2022-12-11 NOTE — Anesthesia Postprocedure Evaluation (Signed)
Anesthesia Post Note  Patient: Theodore Garcia  Procedure(s) Performed: COLONOSCOPY WITH PROPOFOL POLYPECTOMY  Patient location during evaluation: Endoscopy Anesthesia Type: General Level of consciousness: awake and alert Pain management: pain level controlled Vital Signs Assessment: post-procedure vital signs reviewed and stable Respiratory status: spontaneous breathing, nonlabored ventilation, respiratory function stable and patient connected to nasal cannula oxygen Cardiovascular status: blood pressure returned to baseline and stable Postop Assessment: no apparent nausea or vomiting Anesthetic complications: no   No notable events documented.   Last Vitals:  Vitals:   12/10/22 1140 12/10/22 1303  BP: (!) 132/90 (!) 96/58  Pulse: (!) 105   Resp: 20 18  Temp: (!) 35.7 C 36.8 C  SpO2: 100%     Last Pain:  Vitals:   12/11/22 0729  TempSrc:   PainSc: 0-No pain                 Stephanie Coup

## 2022-12-17 NOTE — Plan of Care (Signed)
[facility] Tonsillectomy/Adenoidectomy, Postoperative PEDS care plan entered in error.

## 2023-10-22 NOTE — Progress Notes (Signed)
 This note has been created using automated tools and reviewed for accuracy by [facility].  Chief Complaint  Patient presents with  . Follow-up    Lower back pain/ some hip pain     Subjective  Theodore Garcia is a 53 y.o. male who presents for Follow-up (Lower back pain/ some hip pain/) HPI History of Present Illness Theodore Garcia is a 53 year old male who presents with low back pain.  He experiences persistent low back pain described as a 'fire' in his back, exacerbated by movements such as bending over and getting up from a seated position. He has difficulty getting out of the car due to the pain.  Approximately two weeks ago, he had a fall attributed to his legs giving out. He experiences numbness in one leg and two fingers, and his knee 'always hurts.'  He notes significant weakness in his legs, with difficulty standing and poor balance. 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  He reports numbness and tingling in his legs 'all the time.'  He has a history of bilateral total hip replacements. X-rays reveal extensive facet arthritis, particularly at L4-L5, degenerative disc disease, and mild calcifications within the aorta. There is no evidence of spondylolisthesis or scoliosis, and the SI joints are normal.  Review of Systems  Patient Active Problem List  Diagnosis  . ADHD (attention deficit hyperactivity disorder), inattentive type  . Hypertension  . Hyperlipidemia  . Anxiety  . Closed right tibial fracture  . Tibial fracture  . DDD (degenerative disc disease), lumbosacral  . History of left hip hemiarthroplasty  . Headache disorder  . Loss of memory  . Allergic rhinitis    Outpatient Medications Prior to Visit  Medication Sig Dispense Refill  . albuterol MDI, PROVENTIL, VENTOLIN, PROAIR, HFA 90 mcg/actuation inhaler Inhale 2 inhalations into the lungs as  directed 1 each 2  . ALPRAZolam  (XANAX ) 0.5 MG tablet Take 1 tablet (0.5 mg total) by mouth 3 (three) times daily 90 tablet 5  . dextroamphetamine -amphetamine  (ADDERALL XR) 25 MG XR capsule Take 1 capsule (25 mg total) by mouth once daily for 30 days 30 capsule 0  . [START ON 10/24/2023] dextroamphetamine -amphetamine  (ADDERALL XR) 25 MG XR capsule Take 1 capsule (25 mg total) by mouth once daily for 30 days 30 capsule 0  . [START ON 11/23/2023] dextroamphetamine -amphetamine  (ADDERALL XR) 25 MG XR capsule Take 1 capsule (25 mg total) by mouth once daily for 30 days 30 capsule 0  . dextroamphetamine -amphetamine  (ADDERALL) 10 mg tablet Take 1 tablet (10 mg total) by mouth every evening for 30 days 30 tablet 0  . [START ON 10/24/2023] dextroamphetamine -amphetamine  (ADDERALL) 10 mg tablet Take 1 tablet (10 mg total) by mouth every evening for 30 days 30 tablet 0  . [START ON 11/23/2023] dextroamphetamine -amphetamine  (ADDERALL) 10 mg tablet Take 1 tablet (10 mg total) by mouth every evening for 30 days 30 tablet 0  . dilTIAZem (CARDIZEM CD) 180 MG CD capsule Take 1 capsule (180 mg total) by mouth once daily 90 capsule 3  . DUPIXENT 300 mg/2 mL Syrg monthly  10  . fenofibrate nanocrystallized (TRICOR) 48 MG tablet TAKE ONE TABLET EVERY DAY 90 tablet 3  . levocetirizine (XYZAL) 5 MG tablet Take 1 tablet (5 mg total) by mouth every evening 30 tablet 11  . omega-3 acid ethyl esters (LOVAZA )  1 gram capsule TAKE 2 CAPSULES TWICE DAILY 120 capsule 11  . ranolazine (RANEXA) 1,000 mg ER tablet TAKE ONE TABLET BY MOUTH TWICE DAILY 60 tablet 11  . rosuvastatin (CRESTOR) 40 MG tablet TAKE ONE TABLET EVERY DAY 90 tablet 3  . aspirin  81 MG EC tablet Take 1 tablet (81 mg total) by mouth once daily 30 tablet 11  . dextroamphetamine -amphetamine  (ADDERALL XR) 25 MG XR capsule Take 1 capsule (25 mg total) by mouth once daily for 30 days 30 capsule 0  . dextroamphetamine -amphetamine  (ADDERALL) 10 mg tablet Take 1 tablet (10 mg  total) by mouth every evening for 30 days 30 tablet 0  . sildenafiL (VIAGRA) 100 MG tablet Take 1 tablet (100 mg total) by mouth once daily as needed for Erectile Dysfunction for up to 90 days 90 tablet 0   No facility-administered medications prior to visit.      Objective  Vitals:   10/21/23 1433 10/21/23 1434  BP: (!) 152/104   Weight: 100.7 kg (222 lb)   PainSc:   8   8  PainLoc: Back Back   Body mass index is 26.67 kg/m.  Home Vitals:     Physical Exam Physical Exam MUSCULOSKELETAL: Decreased strength in lower extremities. Calf atrophy. NEUROLOGICAL: Absent patellar and Achilles reflexes.  Constitutional: alert, in NAD, and communicates well   Results RADIOLOGY Lumbar spine X-ray: Extensive facet arthritis, no spondylolisthesis, degenerative disc disease, mild calcifications within the aorta, no scoliosis, intact pedicles, normal SI joints, evidence of bilateral prior total hip replacements, marked facet arthritis at L4-L5.     Assessment/Plan:   Assessment & Plan Lumbar spinal stenosis with facet joint arthritis and degenerative disc disease   He experiences chronic low back pain due to extensive facet arthritis, especially at L4-L5, and degenerative disc disease. There is no spondylolisthesis or scoliosis, but mild aortic calcifications are present. Suspected lumbar spinal stenosis may be contributing to his symptoms, with absent patellar and Achilles reflexes noted. Order an MRI of the lumbar spine to assess for significant stenosis. Consider epidural steroid injections if significant stenosis is confirmed.  Lower extremity weakness, atrophy, and gait impairment   He has significant lower extremity weakness, quadriceps and calf atrophy, difficulty standing, and poor balance. Spinal stenosis or neuropathy are suspected causes, and he is unable to walk on heels. Order an MRI of the lumbar spine to evaluate for significant stenosis. If the MRI does not reveal  significant stenosis, proceed with an EMG nerve conduction test to assess for neuropathy.  Paresthesia of left leg and two fingers   He reports chronic numbness and tingling in the left leg and two fingers, with possible neuropathy or spinal stenosis as underlying causes. Order an MRI of the lumbar spine to check for significant stenosis. If the MRI does not indicate significant stenosis, conduct an EMG nerve conduction test to evaluate for neuropathy. 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           Future Appointments     Date/Time Provider Department Center Visit Type   11/09/2023 2:30 PM Callwood, Dwayne  Marinda, MD [facility] C FOLLOW UP   12/07/2023 11:30 AM [facility] LAB [facility] [place] [facility] LAB   12/14/2023 11:30 AM Valora Lynwood FALCON, MD [facility] [place] [facility] PHYSICAL       There are no Patient Instructions on file for this visit.  An after visit summary was provided for the patient either in written format (printed) or through [facility].  This note has been created using automated tools and reviewed for accuracy by [facility].

## 2023-11-20 ENCOUNTER — Encounter: Payer: Self-pay | Admitting: Emergency Medicine

## 2023-11-20 ENCOUNTER — Emergency Department
Admission: EM | Admit: 2023-11-20 | Discharge: 2023-11-20 | Discharge status: Against Medical Advice | Source: Ambulatory Visit | Attending: Emergency Medicine | Admitting: Emergency Medicine

## 2023-11-20 ENCOUNTER — Other Ambulatory Visit: Payer: Self-pay

## 2023-11-20 DIAGNOSIS — Z5321 Procedure and treatment not carried out due to patient leaving prior to being seen by health care provider: Secondary | ICD-10-CM | POA: Diagnosis present

## 2023-11-20 DIAGNOSIS — R531 Weakness: Secondary | ICD-10-CM | POA: Diagnosis not present

## 2023-11-20 LAB — CBC
HCT: 44.9 % (ref 39.0–52.0)
Hemoglobin: 15.4 g/dL (ref 13.0–17.0)
MCH: 33.8 pg (ref 26.0–34.0)
MCHC: 34.3 g/dL (ref 30.0–36.0)
MCV: 98.7 fL (ref 80.0–100.0)
Platelets: 139 K/uL — ABNORMAL LOW (ref 150–400)
RBC: 4.55 MIL/uL (ref 4.22–5.81)
RDW: 13.7 % (ref 11.5–15.5)
WBC: 5.9 K/uL (ref 4.0–10.5)
nRBC: 0 % (ref 0.0–0.2)

## 2023-11-20 LAB — COMPREHENSIVE METABOLIC PANEL WITH GFR
ALT: 36 U/L (ref 0–44)
AST: 38 U/L (ref 15–41)
Albumin: 4.5 g/dL (ref 3.5–5.0)
Alkaline Phosphatase: 74 U/L (ref 38–126)
Anion gap: 11 (ref 5–15)
BUN: 25 mg/dL — ABNORMAL HIGH (ref 6–20)
CO2: 24 mmol/L (ref 22–32)
Calcium: 10 mg/dL (ref 8.9–10.3)
Chloride: 105 mmol/L (ref 98–111)
Creatinine, Ser: 1.24 mg/dL (ref 0.61–1.24)
GFR, Estimated: >60 mL/min (ref >60)
Glucose, Bld: 123 mg/dL — ABNORMAL HIGH (ref 70–99)
Potassium: 3.7 mmol/L (ref 3.5–5.1)
Sodium: 140 mmol/L (ref 135–145)
Total Bilirubin: 0.5 mg/dL (ref 0.0–1.2)
Total Protein: 8.3 g/dL — ABNORMAL HIGH (ref 6.5–8.1)

## 2023-11-20 NOTE — ED Notes (Addendum)
 Pt mother up to desk asking about wait time, informed cannot give wait time. Asking when pt will get imaging that Dr Kathlynn ordered, informed that Dr Kathlynn cannot order imaging in ED, can only order outpatient imaging, pt will need to see ER doctor for further testing, family and pt began yelling at staff something is seriously messed up with yall, my doctor ordered Ct scans for me here, that should have already been done, Rn attempted to explain process to pt and family, unable to do so d/t verbal aggression. Visualized walking out of ED in NAD

## 2023-11-20 NOTE — ED Triage Notes (Signed)
 Patient to ED via POV for generalized weakness. C/o of multiple things- not eating, back pain, neuropathy, sweating, vomiting. States they were sent by multiple doctors for multiple different scans.

## 2023-11-24 ENCOUNTER — Other Ambulatory Visit: Payer: Self-pay | Admitting: Neurology

## 2023-11-24 DIAGNOSIS — R4189 Other symptoms and signs involving cognitive functions and awareness: Secondary | ICD-10-CM

## 2023-11-27 ENCOUNTER — Other Ambulatory Visit: Payer: Self-pay | Admitting: Orthopedic Surgery

## 2023-11-27 DIAGNOSIS — M4807 Spinal stenosis, lumbosacral region: Secondary | ICD-10-CM

## 2023-11-29 ENCOUNTER — Ambulatory Visit
Admission: RE | Admit: 2023-11-29 | Discharge: 2023-11-29 | Disposition: A | Source: Ambulatory Visit | Attending: Orthopedic Surgery | Admitting: Orthopedic Surgery

## 2023-11-29 ENCOUNTER — Ambulatory Visit
Admission: RE | Admit: 2023-11-29 | Discharge: 2023-11-29 | Disposition: A | Discharge status: Home / Self Care | Source: Ambulatory Visit | Attending: Neurology | Admitting: Neurology

## 2023-11-29 DIAGNOSIS — R4189 Other symptoms and signs involving cognitive functions and awareness: Secondary | ICD-10-CM | POA: Diagnosis present

## 2023-11-29 DIAGNOSIS — M4807 Spinal stenosis, lumbosacral region: Secondary | ICD-10-CM | POA: Diagnosis present

## 2024-01-11 NOTE — Progress Notes (Signed)
 This note has been created using automated tools and reviewed for accuracy by [facility].  Chief Complaint  Patient presents with  . Left Hip - Pain  . Right Hip - Pain  . Lower Back - Pain    Subjective  Theodore Garcia is a 53 y.o. male who presents for Pain of the Left Hip, Pain of the Right Hip, and Pain of the Lower Back HPI History of Present Illness Theodore Garcia is a 53 year old male who presents with persistent back and knee pain.  He experiences severe sciatica pain primarily located in the lower back, radiating to the back of the knee. The pain is described as 'terrible' and often disrupts his sleep. He spends most of his time in bed due to the pain, using a vibrating heating pad for relief. An MRI showed mild degenerative disc disease with a small left central disc protrusion and minimal impingement. A nerve conduction study was performed, but the patient has not received the results.  He experiences numbness in two fingers and pain in the lower back, affecting the 'whole thing.' Pain is also present in the back of the knee, which has not been evaluated with an x-ray for nine years. He has full motion in the knee but experiences pain in the back and side of the knee when moving it.  He had a serious tick bite on his back in late June, which was infected. A blood test for Lyme disease and other tick-borne illnesses was conducted, and the results were negative.  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  Review of Systems  Patient Active Problem List  Diagnosis  . ADHD (attention deficit hyperactivity disorder), inattentive type  . Hypertension  . Hyperlipidemia  . Anxiety  . Closed right tibial fracture  . Tibial fracture  . DDD (degenerative disc disease),  lumbosacral  . History of left hip hemiarthroplasty  . Headache disorder  . Loss of memory  . Allergic rhinitis  . Atopic dermatitis, unspecified  . Exercise-induced bronchospasm (HHS-HCC)  . Allergic rhinitis due to animal hair and dander    Outpatient Medications Prior to Visit  Medication Sig Dispense Refill  . albuterol MDI, PROVENTIL, VENTOLIN, PROAIR, HFA 90 mcg/actuation inhaler Inhale 2 inhalations into the lungs as directed 1 each 2  . ALPRAZolam  (XANAX  XR) 2 MG 24 hr tablet Take 1 tablet (2 mg total) by mouth every morning for 180 days 30 tablet 5  . dextroamphetamine -amphetamine  (ADDERALL XR) 25 MG XR capsule Take 1 capsule (25 mg total) by mouth once daily for 30 days 30 capsule 0  . [START ON 01/29/2024] dextroamphetamine -amphetamine  (ADDERALL XR) 25 MG XR capsule Take 1 capsule (25 mg total) by mouth once daily for 30 days 30 capsule 0  . [START ON 02/28/2024] dextroamphetamine -amphetamine  (ADDERALL XR) 25 MG XR capsule Take 1 capsule (25 mg total) by mouth once daily for 30 days 30 capsule 0  . dextroamphetamine -amphetamine  (ADDERALL) 10 mg tablet Take 1 tablet (10 mg total) by mouth every evening for 30 days 30 tablet 0  . [START ON 01/29/2024] dextroamphetamine -amphetamine  (ADDERALL) 10 mg tablet Take 1 tablet (10 mg total) by mouth every evening for 30 days 30 tablet 0  . [START ON 02/28/2024] dextroamphetamine -amphetamine  (ADDERALL)  10 mg tablet Take 1 tablet (10 mg total) by mouth every evening for 30 days 30 tablet 0  . dilTIAZem (CARDIZEM CD) 180 MG CD capsule Take 1 capsule (180 mg total) by mouth once daily 90 capsule 3  . DUPIXENT 300 mg/2 mL Syrg monthly  10  . ergocalciferol, vitamin D2, 1,250 mcg (50,000 unit) capsule Take 1 capsule (50,000 Units total) by mouth once a week for 8 doses 8 capsule 0  . fenofibrate nanocrystallized (TRICOR) 48 MG tablet TAKE ONE TABLET EVERY DAY 90 tablet 3  . omega-3 acid ethyl esters (LOVAZA ) 1 gram capsule TAKE 2 CAPSULES TWICE DAILY 120  capsule 11  . ranolazine (RANEXA) 1,000 mg ER tablet TAKE ONE TABLET BY MOUTH TWICE DAILY 60 tablet 11  . rosuvastatin (CRESTOR) 40 MG tablet TAKE ONE TABLET EVERY DAY 90 tablet 3  . sildenafiL (VIAGRA) 100 MG tablet Take 1 tablet (100 mg total) by mouth once daily as needed for Erectile Dysfunction for up to 90 days 90 tablet 0  . aspirin  81 MG EC tablet Take 1 tablet (81 mg total) by mouth once daily 30 tablet 11  . dextroamphetamine -amphetamine  (ADDERALL XR) 25 MG XR capsule Take 1 capsule (25 mg total) by mouth once daily for 30 days 30 capsule 0  . dextroamphetamine -amphetamine  (ADDERALL) 10 mg tablet Take 1 tablet (10 mg total) by mouth every evening for 30 days 30 tablet 0  . ALPRAZolam  (XANAX ) 0.5 MG tablet Take 1 tablet (0.5 mg total) by mouth 3 (three) times daily as needed (Patient not taking: Reported on 01/11/2024) 90 tablet 5   No facility-administered medications prior to visit.      Objective  Vitals:   01/11/24 1003  Weight: (!) 105.2 kg (232 lb)  Height: 193 cm (6' 4)   Body mass index is 28.24 kg/m.  Home Vitals:     Physical Exam Physical Exam MEASUREMENTS: Height- 6'4. GENERAL: Alert, cooperative, well developed, no acute distress. HEENT: Normocephalic, normal oropharynx, moist mucous membranes. CHEST: Clear to auscultation bilaterally, no wheezes, rhonchi, or crackles. CARDIOVASCULAR: Normal heart rate and rhythm, S1 and S2 normal without murmurs. ABDOMEN: Soft, non-tender, non-distended, without organomegaly, normal bowel sounds. EXTREMITIES: No cyanosis or edema. MUSCULOSKELETAL: Full extension and flexion of the knee with diffuse tenderness and ache, positive pain to percussion over distal femur NEUROLOGICAL: Cranial nerves grossly intact, moves all extremities without gross motor or sensory deficit.   Results LABS Lyme disease serology: Negative Spotted fever serology: Negative  RADIOLOGY Lumbar spine MRI: Mild degenerative disc with small left  central disc protrusion, minimal impingement, no significant stenosis Left knee X-ray: Moderate medial joint space narrowing, subchondral sclerosis of tibia, possible avascular necrosis of distal femur, incidental finding of prior right tibial rod, vascular clip in proximal leg, mild effusion, minimal arthritis (01/11/2024)  DIAGNOSTIC STUDIES Nerve conduction study: Neuropathy     Assessment/Plan:   Assessment & Plan Severe left knee pain with possible avascular necrosis and minimal osteoarthritis   He experiences severe left knee pain with full flexion and extension, diffuse tenderness, and positive pain to percussion. X-ray reveals moderate medial joint space narrowing with subchondral sclerosis of the tibia and possible avascular necrosis in the distal femur. Bilateral hip avascular necrosis is also present. Differential diagnosis includes avascular necrosis and minimal osteoarthritis. Ordered MRI of the left knee to evaluate for avascular necrosis. Referred to physiatry for evaluation and possible facet injection.  Sciatica with severe lower back and left leg pain   He suffers from  severe sciatica with pain radiating to the left knee and paresthesia in two fingers. MRI indicates mild degenerative disc with a small left central disc protrusion and minimal impingement, without significant stenosis. Differential diagnosis includes neuropathy, supported by nerve test results. Continue with scheduled nerve test follow-up on December 22nd.  Lumbar facet joint arthritis and degenerative disc disease   Chronic lumbar facet joint arthritis and degenerative disc disease contribute to severe lower back pain. MRI findings do not fully account for the severity of symptoms. Circulation problems due to aortic calcifications and arthritis in facet joints are considered. Referred to physiatry for evaluation and possible facet injection.  Recording duration: 14 minutes Diagnoses and all orders for this  visit:  Lumbar facet joint syndrome -     Ambulatory Referral to Physiatrist  Chronic pain of left knee -     X-ray knee left 3 views; Future  Drug-induced osteonecrosis of left femur (HHS-HCC) -     MRI knee left without contrast; Future  Other orders -     diazePAM (VALIUM) 10 MG tablet; Take 1 tablet (10 mg total) by mouth once for 1 dose Take 30 minutes prior to MRI    This visit was coded based on medical decision making (MDM).           Future Appointments     Date/Time Provider Department Center Visit Type   02/15/2024 2:00 PM Evern Allyson Hacker, NP [facility] RETURN VISIT   06/15/2024 11:15 AM Valora Lynwood FALCON, MD [facility] CL [facility] OFFICE VISIT       There are no Patient Instructions on file for this visit.  An after visit summary was provided for the patient either in written format (printed) or through [facility].  This note has been created using automated tools and reviewed for accuracy by [facility].

## 2024-01-14 ENCOUNTER — Emergency Department
Admission: EM | Admit: 2024-01-14 | Discharge: 2024-01-14 | Disposition: A | Discharge status: Home / Self Care | Source: Ambulatory Visit | Attending: Emergency Medicine | Admitting: Emergency Medicine

## 2024-01-14 DIAGNOSIS — M545 Low back pain, unspecified: Secondary | ICD-10-CM | POA: Insufficient documentation

## 2024-01-14 DIAGNOSIS — I10 Essential (primary) hypertension: Secondary | ICD-10-CM | POA: Insufficient documentation

## 2024-01-14 DIAGNOSIS — G8929 Other chronic pain: Secondary | ICD-10-CM | POA: Diagnosis not present

## 2024-01-14 DIAGNOSIS — M25562 Pain in left knee: Secondary | ICD-10-CM | POA: Insufficient documentation

## 2024-01-14 MED ORDER — KETOROLAC TROMETHAMINE 30 MG/ML IJ SOLN
30.0000 mg | Freq: Once | INTRAMUSCULAR | Status: AC
  Administered 2024-01-14: 30 mg via INTRAMUSCULAR
  Filled 2024-01-14: qty 1

## 2024-01-14 MED ORDER — CYCLOBENZAPRINE HCL 10 MG PO TABS
5.0000 mg | ORAL_TABLET | Freq: Once | ORAL | Status: AC
  Administered 2024-01-14: 5 mg via ORAL
  Filled 2024-01-14: qty 1

## 2024-01-14 MED ORDER — DILTIAZEM HCL ER COATED BEADS 180 MG PO CP24
180.0000 mg | ORAL_CAPSULE | Freq: Once | ORAL | Status: AC
  Administered 2024-01-14: 180 mg via ORAL
  Filled 2024-01-14: qty 1

## 2024-01-14 MED ORDER — LIDOCAINE 5 % EX PTCH
1.0000 | MEDICATED_PATCH | CUTANEOUS | Status: DC
  Administered 2024-01-14: 1 via TRANSDERMAL
  Filled 2024-01-14: qty 1

## 2024-01-14 MED ORDER — ACETAMINOPHEN 500 MG PO TABS
1000.0000 mg | ORAL_TABLET | Freq: Once | ORAL | Status: AC
  Administered 2024-01-14: 1000 mg via ORAL
  Filled 2024-01-14: qty 2

## 2024-01-14 NOTE — ED Triage Notes (Signed)
 Pt presents to the ED via POV from home with mother. Pt reports lower back pain, left knee pain, and states that he can't feel his 3rd and 4th finger on his right hand since August. Pt has seen PCP, orthopedic and neurology since August for same. Pt was told today that he was hypertensive. Pt's BP at time of triage 194/123. Pt states that he has not been taking his blood pressure medication since last week. Pt had an MRI of his back today. Pt reports that the pain is the same that it usually is. Rates the pain 8/10.

## 2024-01-14 NOTE — ED Notes (Addendum)
 After entering the pt's room to medicate the pt it was observed that the pt, his mother, Dr. Arlander, Myah the PA, and the student were having a discussion. I'm not sure of the conversation but after administering the medication the pt advised he wanted to leave. The pt thanked us  for his care and exited the room.

## 2024-01-14 NOTE — ED Provider Notes (Signed)
 [facility] Emergency Department Provider Note     Event Date/Time   First MD Initiated Contact with Patient 01/14/24 1504     (approximate)   History   Back Pain and Hypertension   HPI  Theodore Garcia is a 53 y.o. male who is accompanied by his mother with a past medical history of HTN, HLD, ADHD, and anxiety presents to the ED with complaint of chronic pain and hypertension.  Patient reports he cannot recall the last time he took his blood pressure medication. His mother who is also providing history states the patient has anxiety and history of panic attacks.  She states no pain medication help with his pain and that the patient goes to alcohol for pain relief.  Last drink was yesterday. The patient denies any chest pain or shortness of breath. He states his pain is mostly in his lower back and left knee.  Patient denies any falls or injury.       Physical Exam   Triage Vital Signs: ED Triage Vitals  Encounter Vitals Group     BP 01/14/24 1413 (!) 194/123     Girls Systolic BP Percentile --      Girls Diastolic BP Percentile --      Boys Systolic BP Percentile --      Boys Diastolic BP Percentile --      Pulse Rate 01/14/24 1413 (!) 113     Resp 01/14/24 1413 18     Temp 01/14/24 1413 98.2 F (36.8 C)     Temp Source 01/14/24 1413 Oral     SpO2 01/14/24 1413 97 %     Weight 01/14/24 1414 220 lb (99.8 kg)     Height 01/14/24 1414 6' 4.5 (1.943 m)     Head Circumference --      Peak Flow --      Pain Score 01/14/24 1413 8     Pain Loc --      Pain Education --      Exclude from Growth Chart --     Most recent vital signs: Vitals:   01/14/24 1622 01/14/24 1649  BP: (!) 192/116 (!) 180/108  Pulse: 98   Resp: 20   Temp:    SpO2: 96%     General: Anxious.  Alert and oriented. INAD.  Head:  NCAT.  Eyes:  PERRLA. EOMI.  CV:  Good peripheral perfusion. RRR. No peripheral edema.  RESP:  Normal effort. LCTAB. No retractions.   BACK:  Reproducible spinous tenderness to upper lumbar region.  Left paraspinal muscle tenderness.  + Bilateral straight leg raise test.   MSK:   Full ROM in all joints. No swelling, deformity or tenderness.  NEURO: Cranial nerves intact. No focal deficits. Speech is clear. Sensation and motor function intact. Normal muscle strength of UE & LE. Gait is steady.    ED Results / Procedures / Treatments   Labs (all labs ordered are listed, but only abnormal results are displayed) Labs Reviewed - No data to display  No results found.  PROCEDURES:  Critical Care performed: No  Procedures  MEDICATIONS ORDERED IN ED: Medications  lidocaine  (LIDODERM ) 5 % 1 patch (1 patch Transdermal Patch Applied 01/14/24 1556)  ketorolac  (TORADOL ) 30 MG/ML injection 30 mg (30 mg Intramuscular Given 01/14/24 1556)  cyclobenzaprine  (FLEXERIL ) tablet 5 mg (5 mg Oral Given 01/14/24 1617)  acetaminophen  (TYLENOL ) tablet 1,000 mg (1,000 mg Oral Given 01/14/24 1555)  diltiazem  (CARDIZEM  CD) 24 hr  capsule 180 mg (180 mg Oral Given 01/14/24 1649)   IMPRESSION / MDM / ASSESSMENT AND PLAN / ED COURSE  I reviewed the triage vital signs and the nursing notes.                               53 y.o. male presents to the emergency department for evaluation and treatment of acute on chronic pain and hypertension. See HPI for further details.   Differential diagnosis includes, but is not limited to hypertensive urgency, hypertensive emergency less likely, acute on chronic pain syndrome, anxiety  Patient's presentation is most consistent with acute, uncomplicated illness.  Patient's initial vital signs noted with pulse rate of 113 and BP of 194/123.  Patient declines pain to chest or shortness of breath.  No headache or vision changes to indicate further workup such as imaging at this time.  Low suspicion for hypertensive emergency.  Given history provided, the patient has not taken his blood pressure medication in over  a week and his chronic pain, I do suspect this is contributing to his elevated blood pressure.  Physical exam findings are as stated above.  Patient appears very anxious. Plan to treat pain with IM Toradol , Tylenol , lidocaine  patch and muscle relaxer as patient has opted not to take any narcotics as these make him hallucinate per his mother.    Patient is getting anxious and wanting to leave before blood pressure medication administered.  He reports he does not like to be in closed areas and the fire alarms in the ED going off is making him more anxious.  He states he would like to go home and to his own bed.  His pulse rate has gone down significantly since initial vital signs. He is willing to wait for dose of Cardizem  and would like to be discharged.  This is reasonable as I do not suspect further workup is indicated.  I have advised close follow-up with his primary care for further evaluation and blood pressure medication management.  Shared visit with supervising attending, Dr. Arlander, who assessed patient at bedside and is in agreement with this care plan.  FINAL CLINICAL IMPRESSION(S) / ED DIAGNOSES   Final diagnoses:  Chronic low back pain, unspecified back pain laterality, unspecified whether sciatica present   Rx / DC Orders   ED Discharge Orders     None        Note:  This document was prepared using Dragon voice recognition software and may include unintentional dictation errors.    Margrette, Cashlyn Huguley A, PA-C 01/14/24 2033    Arlander [place], MD 01/15/24 [phone]

## 2024-01-29 NOTE — Progress Notes (Signed)
 Large Joint Injection: L knee  Date/Time: 01/29/2024 12:30 PM  Performed by: Lorelle Arthea Arch, MD Authorized by: Lorelle Arthea Arch, MD   Needle Size:  22 G Location:  Knee Site:  L knee Medications:  1 mL BUPivacaine  HCl 0.5 %; 1 mL lidocaine  1 %; 40 mg triamcinolone acetonide 40 mg/mL

## 2024-02-02 ENCOUNTER — Observation Stay
Admission: EM | Admit: 2024-02-02 | Discharge: 2024-02-05 | DRG: 897 | Disposition: A | Attending: Student | Admitting: Student

## 2024-02-02 DIAGNOSIS — R451 Restlessness and agitation: Principal | ICD-10-CM

## 2024-02-02 DIAGNOSIS — I10 Essential (primary) hypertension: Secondary | ICD-10-CM

## 2024-02-02 DIAGNOSIS — I2510 Atherosclerotic heart disease of native coronary artery without angina pectoris: Secondary | ICD-10-CM

## 2024-02-02 DIAGNOSIS — F419 Anxiety disorder, unspecified: Secondary | ICD-10-CM

## 2024-02-02 DIAGNOSIS — F10931 Alcohol use, unspecified with withdrawal delirium: Secondary | ICD-10-CM | POA: Diagnosis present

## 2024-02-02 DIAGNOSIS — J449 Chronic obstructive pulmonary disease, unspecified: Secondary | ICD-10-CM

## 2024-02-02 LAB — CBC WITH DIFFERENTIAL/PLATELET
Abs Immature Granulocytes: 0.62 K/uL — ABNORMAL HIGH (ref 0.00–0.07)
Basophils Absolute: 0.1 K/uL (ref 0.0–0.1)
Basophils Relative: 1 %
Eosinophils Absolute: 0 K/uL (ref 0.0–0.5)
Eosinophils Relative: 0 %
HCT: 41.8 % (ref 39.0–52.0)
Hemoglobin: 14.3 g/dL (ref 13.0–17.0)
Immature Granulocytes: 7 %
Lymphocytes Relative: 25 %
Lymphs Abs: 2.4 K/uL (ref 0.7–4.0)
MCH: 33.6 pg (ref 26.0–34.0)
MCHC: 34.2 g/dL (ref 30.0–36.0)
MCV: 98.4 fL (ref 80.0–100.0)
Monocytes Absolute: 1.2 K/uL — ABNORMAL HIGH (ref 0.1–1.0)
Monocytes Relative: 13 %
Neutro Abs: 5.2 K/uL (ref 1.7–7.7)
Neutrophils Relative %: 54 %
Platelets: 234 K/uL (ref 150–400)
RBC: 4.25 MIL/uL (ref 4.22–5.81)
RDW: 13.4 % (ref 11.5–15.5)
Smear Review: NORMAL
WBC: 9.6 K/uL (ref 4.0–10.5)
nRBC: 0.5 % — ABNORMAL HIGH (ref 0.0–0.2)

## 2024-02-02 LAB — COMPREHENSIVE METABOLIC PANEL WITH GFR
ALT: 32 U/L (ref 0–44)
AST: 28 U/L (ref 15–41)
Albumin: 4.5 g/dL (ref 3.5–5.0)
Alkaline Phosphatase: 95 U/L (ref 38–126)
Anion gap: 18 — ABNORMAL HIGH (ref 5–15)
BUN: 25 mg/dL — ABNORMAL HIGH (ref 6–20)
CO2: 21 mmol/L — ABNORMAL LOW (ref 22–32)
Calcium: 9.6 mg/dL (ref 8.9–10.3)
Chloride: 98 mmol/L (ref 98–111)
Creatinine, Ser: 0.85 mg/dL (ref 0.61–1.24)
GFR, Estimated: >60 mL/min (ref >60)
Glucose, Bld: 253 mg/dL — ABNORMAL HIGH (ref 70–99)
Potassium: 4.2 mmol/L (ref 3.5–5.1)
Sodium: 136 mmol/L (ref 135–145)
Total Bilirubin: 0.3 mg/dL (ref 0.0–1.2)
Total Protein: 7.5 g/dL (ref 6.5–8.1)

## 2024-02-02 LAB — SALICYLATE LEVEL: Salicylate Lvl: <7 mg/dL — ABNORMAL LOW (ref 7.0–30.0)

## 2024-02-02 LAB — ETHANOL: Alcohol, Ethyl (B): 96 mg/dL — ABNORMAL HIGH (ref <15)

## 2024-02-02 LAB — ACETAMINOPHEN LEVEL: Acetaminophen (Tylenol), Serum: <10 ug/mL — ABNORMAL LOW (ref 10–30)

## 2024-02-02 MED ORDER — ASPIRIN 81 MG PO TBEC
81.0000 mg | DELAYED_RELEASE_TABLET | Freq: Every day | ORAL | Status: DC
  Administered 2024-02-03 – 2024-02-05 (×3): 81 mg via ORAL
  Filled 2024-02-02 (×3): qty 1

## 2024-02-02 MED ORDER — RANOLAZINE ER 500 MG PO TB12
1000.0000 mg | ORAL_TABLET | Freq: Two times a day (BID) | ORAL | Status: DC
  Filled 2024-02-02 (×2): qty 2

## 2024-02-02 MED ORDER — LORAZEPAM 2 MG/ML IJ SOLN
2.0000 mg | Freq: Once | INTRAMUSCULAR | Status: AC
  Administered 2024-02-02: 2 mg via INTRAVENOUS
  Filled 2024-02-02: qty 1

## 2024-02-02 MED ORDER — ACETAMINOPHEN 650 MG RE SUPP: 650.0000 mg | Freq: Four times a day (QID) | RECTAL | Status: DC | PRN

## 2024-02-02 MED ORDER — LORAZEPAM 2 MG/ML IJ SOLN
1.0000 mg | INTRAMUSCULAR | Status: DC | PRN
  Administered 2024-02-04 – 2024-02-05 (×2): 2 mg via INTRAVENOUS
  Filled 2024-02-02 (×2): qty 1

## 2024-02-02 MED ORDER — THIAMINE HCL 100 MG/ML IJ SOLN: 100.0000 mg | Freq: Every day | INTRAMUSCULAR | Status: DC

## 2024-02-02 MED ORDER — LORAZEPAM 2 MG/ML IJ SOLN
0.0000 mg | Freq: Four times a day (QID) | INTRAMUSCULAR | Status: AC
  Administered 2024-02-02: 1 mg via INTRAVENOUS
  Administered 2024-02-02: 4 mg via INTRAVENOUS
  Filled 2024-02-02: qty 2
  Filled 2024-02-02: qty 1

## 2024-02-02 MED ORDER — ONDANSETRON HCL 4 MG PO TABS: 4.0000 mg | ORAL_TABLET | Freq: Four times a day (QID) | ORAL | Status: DC | PRN

## 2024-02-02 MED ORDER — PHENOBARBITAL SODIUM 65 MG/ML IJ SOLN
65.0000 mg | Freq: Three times a day (TID) | INTRAMUSCULAR | Status: AC
  Administered 2024-02-02 – 2024-02-04 (×5): 65 mg via INTRAVENOUS
  Filled 2024-02-02 (×5): qty 1

## 2024-02-02 MED ORDER — ONDANSETRON HCL 4 MG/2ML IJ SOLN: 4.0000 mg | Freq: Four times a day (QID) | INTRAMUSCULAR | Status: DC | PRN

## 2024-02-02 MED ORDER — ROSUVASTATIN CALCIUM 20 MG PO TABS
40.0000 mg | ORAL_TABLET | Freq: Every day | ORAL | Status: DC
  Administered 2024-02-03 – 2024-02-05 (×3): 40 mg via ORAL
  Filled 2024-02-02: qty 2
  Filled 2024-02-02 (×2): qty 4

## 2024-02-02 MED ORDER — THIAMINE HCL 100 MG/ML IJ SOLN
200.0000 mg | INTRAVENOUS | Status: DC
  Administered 2024-02-02 – 2024-02-03 (×2): 200 mg via INTRAVENOUS
  Filled 2024-02-02 (×5): qty 2

## 2024-02-02 MED ORDER — LORAZEPAM 2 MG PO TABS
0.0000 mg | ORAL_TABLET | Freq: Four times a day (QID) | ORAL | Status: AC
  Administered 2024-02-03: 2 mg via ORAL
  Administered 2024-02-03: 1 mg via ORAL
  Administered 2024-02-04: 3 mg via ORAL
  Filled 2024-02-02: qty 2
  Filled 2024-02-02 (×2): qty 1

## 2024-02-02 MED ORDER — ALPRAZOLAM ER 1 MG PO TB24: 2.0000 mg | ORAL_TABLET | Freq: Every day | ORAL | Status: DC

## 2024-02-02 MED ORDER — ENOXAPARIN SODIUM 40 MG/0.4ML IJ SOSY
40.0000 mg | PREFILLED_SYRINGE | Freq: Every day | INTRAMUSCULAR | Status: DC
  Administered 2024-02-02 – 2024-02-04 (×3): 40 mg via SUBCUTANEOUS
  Filled 2024-02-02 (×3): qty 0.4

## 2024-02-02 MED ORDER — ZIPRASIDONE MESYLATE 20 MG IM SOLR
20.0000 mg | Freq: Once | INTRAMUSCULAR | Status: AC
  Administered 2024-02-02: 20 mg via INTRAMUSCULAR
  Filled 2024-02-02: qty 20

## 2024-02-02 MED ORDER — FOLIC ACID 1 MG PO TABS
1.0000 mg | ORAL_TABLET | Freq: Every day | ORAL | Status: DC
  Administered 2024-02-03 – 2024-02-05 (×3): 1 mg via ORAL
  Filled 2024-02-02 (×3): qty 1

## 2024-02-02 MED ORDER — AMPHETAMINE-DEXTROAMPHET ER 25 MG PO CP24
25.0000 mg | ORAL_CAPSULE | Freq: Every day | ORAL | Status: DC
  Filled 2024-02-02: qty 1

## 2024-02-02 MED ORDER — LORAZEPAM 2 MG/ML IJ SOLN: 0.0000 mg | Freq: Two times a day (BID) | INTRAMUSCULAR | Status: DC

## 2024-02-02 MED ORDER — LORAZEPAM 2 MG PO TABS
0.0000 mg | ORAL_TABLET | Freq: Two times a day (BID) | ORAL | Status: DC
  Administered 2024-02-05: 1 mg via ORAL
  Filled 2024-02-02: qty 1

## 2024-02-02 MED ORDER — ALBUTEROL SULFATE (2.5 MG/3ML) 0.083% IN NEBU
2.5000 mg | INHALATION_SOLUTION | RESPIRATORY_TRACT | Status: DC | PRN
  Administered 2024-02-03: 2.5 mg via RESPIRATORY_TRACT
  Filled 2024-02-02: qty 3

## 2024-02-02 MED ORDER — ISOSORBIDE MONONITRATE ER 60 MG PO TB24
30.0000 mg | ORAL_TABLET | Freq: Every day | ORAL | Status: DC
  Administered 2024-02-03 – 2024-02-05 (×3): 30 mg via ORAL
  Filled 2024-02-02 (×4): qty 1

## 2024-02-02 MED ORDER — METOPROLOL SUCCINATE ER 25 MG PO TB24
25.0000 mg | ORAL_TABLET | Freq: Every day | ORAL | Status: DC
  Administered 2024-02-03 – 2024-02-05 (×3): 25 mg via ORAL
  Filled 2024-02-02 (×3): qty 1

## 2024-02-02 MED ORDER — DILTIAZEM HCL ER COATED BEADS 180 MG PO CP24
180.0000 mg | ORAL_CAPSULE | Freq: Every day | ORAL | Status: DC
  Administered 2024-02-03 – 2024-02-05 (×3): 180 mg via ORAL
  Filled 2024-02-02 (×3): qty 1

## 2024-02-02 MED ORDER — THIAMINE MONONITRATE 100 MG PO TABS
100.0000 mg | ORAL_TABLET | ORAL | Status: DC
  Administered 2024-02-04: 100 mg via ORAL
  Filled 2024-02-02 (×2): qty 1

## 2024-02-02 MED ORDER — PHENOBARBITAL SODIUM 65 MG/ML IJ SOLN
32.5000 mg | Freq: Three times a day (TID) | INTRAMUSCULAR | Status: DC
  Administered 2024-02-04 – 2024-02-05 (×3): 32.5 mg via INTRAVENOUS
  Filled 2024-02-02 (×3): qty 1

## 2024-02-02 MED ORDER — ADULT MULTIVITAMIN W/MINERALS CH
1.0000 | ORAL_TABLET | Freq: Every day | ORAL | Status: DC
  Administered 2024-02-03 – 2024-02-05 (×3): 1 via ORAL
  Filled 2024-02-02 (×3): qty 1

## 2024-02-02 MED ORDER — ACETAMINOPHEN 325 MG PO TABS: 650.0000 mg | ORAL_TABLET | Freq: Four times a day (QID) | ORAL | Status: DC | PRN

## 2024-02-02 MED ORDER — FENOFIBRATE 54 MG PO TABS
54.0000 mg | ORAL_TABLET | Freq: Every day | ORAL | Status: DC
  Administered 2024-02-03 – 2024-02-05 (×3): 54 mg via ORAL
  Filled 2024-02-02 (×4): qty 1

## 2024-02-02 MED ORDER — THIAMINE MONONITRATE 100 MG PO TABS
100.0000 mg | ORAL_TABLET | Freq: Every day | ORAL | Status: DC
  Administered 2024-02-02: 100 mg via ORAL
  Filled 2024-02-02: qty 1

## 2024-02-02 MED ORDER — HYDROCODONE-ACETAMINOPHEN 5-325 MG PO TABS
1.0000 | ORAL_TABLET | ORAL | Status: DC | PRN
  Administered 2024-02-04: 2 via ORAL
  Administered 2024-02-04: 1 via ORAL
  Administered 2024-02-04 – 2024-02-05 (×5): 2 via ORAL
  Filled 2024-02-02 (×4): qty 2
  Filled 2024-02-02: qty 1
  Filled 2024-02-02 (×2): qty 2

## 2024-02-02 NOTE — Hospital Course (Signed)
 Theodore Garcia

## 2024-02-02 NOTE — Assessment & Plan Note (Signed)
 Phenobarbital  withdrawal protocol Ativan  as needed for additional sedation Thiamine  folate and MVI Close monitoring in PCU Neurologic checks with fall and aspiration precautions

## 2024-02-02 NOTE — Assessment & Plan Note (Signed)
 Not acutely exacerbated DuoNebs as needed

## 2024-02-02 NOTE — ED Provider Notes (Signed)
 [facility] Provider Note    Event Date/Time   First MD Initiated Contact with Patient 02/02/24 1309     (approximate)   History   Withdrawal and Psychiatric Evaluation   HPI  Theodore Garcia is a 53 y.o. male with history of alcohol abuse presenting to the emergency department via EMS for agitation.  EMS reports that they were initially called out for a stroke.  The patient reports that he has a swooshing sensation in his head that is only relieved by him screaming and flailing.  He reports that he has not had anything to drink since last night but that this does not feel like his withdrawal symptoms.  He denies taking any other medications or illicit substances.  EMS denies any known psych history.     Physical Exam   Triage Vital Signs: ED Triage Vitals  Encounter Vitals Group     BP 02/02/24 1311 (!) 181/109     Girls Systolic BP Percentile --      Girls Diastolic BP Percentile --      Boys Systolic BP Percentile --      Boys Diastolic BP Percentile --      Pulse Rate 02/02/24 1311 99     Resp 02/02/24 1311 20     Temp 02/02/24 1311 98.2 F (36.8 C)     Temp Source 02/02/24 1311 Oral     SpO2 02/02/24 1311 98 %     Weight 02/02/24 1320 220 lb 0.3 oz (99.8 kg)     Height 02/02/24 1320 6' (1.829 m)     Head Circumference --      Peak Flow --      Pain Score 02/02/24 1311 0     Pain Loc --      Pain Education --      Exclude from Growth Chart --     Most recent vital signs: Vitals:   02/02/24 1311 02/02/24 1316  BP: (!) 181/109   Pulse: 99   Resp: 20   Temp: 98.2 F (36.8 C)   SpO2: 98% 98%     General: Agitated, flailing his limbs on the bed CV:  Good peripheral perfusion.  Resp:  Normal effort.  Abd:  No distention.  Other:  Agitated, unable to keep still   ED Results / Procedures / Treatments   Labs (all labs ordered are listed, but only abnormal results are displayed) Labs Reviewed  COMPREHENSIVE METABOLIC PANEL  WITH GFR  ETHANOL  URINE DRUG SCREEN  CBC WITH DIFFERENTIAL/PLATELET  ACETAMINOPHEN  LEVEL  SALICYLATE LEVEL     EKG  EKG shows a sinus rhythm at a rate of 85 bpm, prolonged PR interval, normal axis, no significant ST elevations or depressions   RADIOLOGY     PROCEDURES:  Critical Care performed: No  Procedures   MEDICATIONS ORDERED IN ED: Medications  ziprasidone  (GEODON ) injection 20 mg (has no administration in time range)     IMPRESSION / MDM / ASSESSMENT AND PLAN / ED COURSE  I reviewed the triage vital signs and the nursing notes.                              Differential diagnosis includes, but is not limited to, alcohol withdrawal, drug intoxication, metabolic encephalopathy  Patient's presentation is most consistent with acute presentation with potential threat to life or bodily function.  The patient has been placed in psychiatric observation  due to the need to provide a safe environment for the patient while obtaining psychiatric consultation and evaluation, as well as ongoing medical and medication management to treat the patient's condition.  The patient has not been placed under full IVC at this time.   Upon presentation the patient was treated with 20 mg of IM Geodon  for agitation.  Lab work collected for further evaluation.    On blood work the patient's alcohol level 96.  Salicylate and acetaminophen  levels are negative.  UDS pending.  Patient is pending evaluation by psychiatry.  FINAL CLINICAL IMPRESSION(S) / ED DIAGNOSES   Final diagnoses:  Agitation     Rx / DC Orders   ED Discharge Orders     None        Note:  This document was prepared using Dragon voice recognition software and may include unintentional dictation errors.   Rexford Reche HERO, MD 02/02/24 1505

## 2024-02-02 NOTE — ED Notes (Signed)
 VOL/  PENDING  CONSULT

## 2024-02-02 NOTE — H&P (Signed)
 History and Physical    Patient: Theodore Garcia FMW:[id number] DOB: 1970/09/03 DOA: 02/02/2024 DOS: the patient was seen and examined on 02/02/2024 PCP: Valora Lynwood FALCON, MD  Patient coming from: Home  Chief Complaint:  Chief Complaint  Patient presents with   Withdrawal   Agitation    HPI: Theodore Garcia is a 53 y.o. male with medical history significant for HTN, anxiety, COPD, nonobstructive CAD, alcohol use disorder, being admitted with acute alcohol withdrawal delirium, with unmanageable symptoms after multiple doses of Ativan  as well as Geodon  in the emergency room.  He was brought in by EMS with agitation, screaming and jerking sensations.  States that he drinks about 1/5 a day.  ICU was called to evaluate for admission but at the time of the assessment patient had started to improve with treatment and medicine admission requested. In the ED was hypertensive and tachycardic with otherwise normal vitals.  Labs notable for EtOH of 96, blood glucose 253 with anion gap 18 and bicarb 21. As mentioned, treated with Geodon  and Ativan . Admission requested     Past Medical History:  Diagnosis Date   ADHD (attention deficit hyperactivity disorder)    Anxiety    Asthma    Depression    Eczema    GERD (gastroesophageal reflux disease)    Hyperlipidemia    Hypertension    Osteoporosis    Seasonal allergies    Past Surgical History:  Procedure Laterality Date   BONE GRAFT HIP ILIAC CREST     left hip   COLONOSCOPY WITH PROPOFOL  N/A 12/10/2022   Procedure: COLONOSCOPY WITH PROPOFOL ;  Surgeon: Toledo, Ladell POUR, MD;  Location: [facility] ENDOSCOPY;  Service: Gastroenterology;  Laterality: N/A;   craniotomy     for closed fontanella   LEFT HEART CATH AND CORONARY ANGIOGRAPHY Left 06/26/2021   Procedure: LEFT HEART CATH AND CORONARY ANGIOGRAPHY;  Surgeon: Florencio Cara BIRCH, MD;  Location: [facility] INVASIVE CV LAB;  Service: Cardiovascular;  Laterality: Left;   POLYPECTOMY  12/10/2022    Procedure: POLYPECTOMY;  Surgeon: Aundria, Ladell POUR, MD;  Location: [facility] ENDOSCOPY;  Service: Gastroenterology;;   TIBIA IM NAIL INSERTION Right 05/23/2015   Procedure: INTRAMEDULLARY (IM) NAIL TIBIAL;  Surgeon: Kayla Pinal, MD;  Location: [facility] ORS;  Service: Orthopedics;  Laterality: Right;   TOTAL HIP ARTHROPLASTY     Left hip   TOTAL HIP ARTHROPLASTY Right 11/08/2019   Procedure: TOTAL HIP ARTHROPLASTY ANTERIOR APPROACH;  Surgeon: Kathlynn Sharper, MD;  Location: [facility] ORS;  Service: Orthopedics;  Laterality: Right;   WISDOM TOOTH EXTRACTION     WRIST FRACTURE SURGERY     Right wrist   Social History:  reports that he has been smoking cigarettes. He has a 39 pack-year smoking history. He has never used smokeless tobacco. He reports current alcohol use of about 5.0 standard drinks of alcohol per week. He reports that he does not currently use drugs after having used the following drugs: Marijuana.  Allergies  Allergen Reactions   Cefazolin  Anaphylaxis   Clindamycin  Hcl Anaphylaxis    Throat closing/swelling/difficulty breathing   Penicillins Swelling, Rash and Other (See Comments)    Reaction:  Tongue swelling  Has patient had a PCN reaction causing immediate rash, facial/tongue/throat swelling, SOB or lightheadedness with hypotension: Yes Has patient had a PCN reaction causing severe rash involving mucus membranes or skin necrosis: No Has patient had a PCN reaction that required hospitalization No Has patient had a PCN reaction occurring within the last 10 years: No If  all of the above answers are NO, then may proceed with Cephalosporin use.    Family History  Problem Relation Age of Onset   Heart disease Mother    CAD Mother        had CABG   Heart murmur Father    Heart Problems Father    Prostate cancer Maternal Grandfather     Prior to Admission medications   Medication Sig Start Date End Date Taking? Authorizing Provider  albuterol  (VENTOLIN  HFA) 108 (90 Base) MCG/ACT  inhaler Inhale 2 puffs into the lungs every 6 (six) hours as needed for wheezing or shortness of breath.   Yes [provider]  ALPRAZolam  (XANAX  XR) 2 MG 24 hr tablet Take 2 mg by mouth daily.   Yes [provider]  amphetamine -dextroamphetamine  (ADDERALL XR) 25 MG 24 hr capsule Take 25 mg by mouth every morning.   Yes [provider]  amphetamine -dextroamphetamine  (ADDERALL) 10 MG tablet Take 10 mg by mouth daily as needed (depends on work schedule).   Yes [provider]  aspirin  EC 81 MG tablet Take 81 mg by mouth daily. Swallow whole.   Yes [provider]  diltiazem  (CARDIZEM  CD) 180 MG 24 hr capsule Take 180 mg by mouth daily.   Yes [provider]  EPINEPHrine  0.3 mg/0.3 mL IJ SOAJ injection Inject 0.3 mg into the muscle once as needed (for severe allergic reaction).    Yes [provider]  fenofibrate  (TRICOR ) 48 MG tablet Take 48 mg by mouth daily.   Yes [provider]  lidocaine  (LIDODERM ) 5 % Place 1 patch onto the skin daily. 01/29/24  Yes [provider]  omega-3 acid ethyl esters (LOVAZA ) 1 g capsule Take 2 g by mouth daily. 10/24/19  Yes [provider]  ranolazine  (RANEXA ) 1000 MG SR tablet Take 1,000 mg by mouth 2 (two) times daily.   Yes [provider]  rosuvastatin  (CRESTOR ) 40 MG tablet Take 40 mg by mouth daily.   Yes [provider]  tiZANidine (ZANAFLEX) 2 MG tablet Take 2 mg by mouth every 8 (eight) hours as needed for muscle spasms. 01/29/24 01/28/25 Yes [provider]  ALPRAZolam  (XANAX ) 0.5 MG tablet Take 0.5 mg by mouth 3 (three) times daily as needed for anxiety or sleep. Patient not taking: Reported on 02/02/2024    [provider]  clopidogrel (PLAVIX) 75 MG tablet Take 75 mg by mouth daily.    [provider]  Dupilumab (DUPIXENT) 300 MG/2ML SOPN Inject 300 mg into the skin every 14 (fourteen) days. Patient not taking: Reported on  02/02/2024    [provider]  isosorbide  mononitrate (IMDUR ) 30 MG 24 hr tablet Take 1 tablet (30 mg total) by mouth daily. 06/26/21 06/26/22  Callwood, Dwayne D, MD  lisinopril  (ZESTRIL ) 20 MG tablet Take 20 mg by mouth daily.    [provider]  metoprolol  succinate (TOPROL -XL) 25 MG 24 hr tablet Take 25 mg by mouth daily.    [provider]  pimecrolimus (ELIDEL) 1 % cream Apply 1 application topically 2 (two) times daily as needed (eczema).    [provider]  ranolazine  (RANEXA ) 500 MG 12 hr tablet Take 500 mg by mouth daily. Patient not taking: Reported on 02/02/2024 05/21/21   [provider]  Vitamin D, Ergocalciferol, (DRISDOL) 1.25 MG (50000 UNIT) CAPS capsule Take 50,000 Units by mouth every 7 (seven) days. Patient not taking: Reported on 02/02/2024 11/26/23   [provider]  Physical Exam: Vitals:   02/02/24 1922 02/02/24 1956 02/02/24 2015 02/02/24 2112  BP: (!) 155/84  (!) 177/87 (!) 175/100  Pulse: 85  85 87  Resp:      Temp:      TempSrc:      SpO2:  94%    Weight:      Height:       Physical Exam  Labs on Admission: I have personally reviewed following labs and imaging studies  CBC: Recent Labs  Lab 02/02/24 1332  WBC 9.6  NEUTROABS 5.2  HGB 14.3  HCT 41.8  MCV 98.4  PLT 234   Basic Metabolic Panel: Recent Labs  Lab 02/02/24 1332  NA 136  K 4.2  CL 98  CO2 21*  GLUCOSE 253*  BUN 25*  CREATININE 0.85  CALCIUM  9.6   GFR: Estimated Creatinine Clearance: 120.1 mL/min (by C-G formula based on SCr of 0.85 mg/dL). Liver Function Tests: Recent Labs  Lab 02/02/24 1332  AST 28  ALT 32  ALKPHOS 95  BILITOT 0.3  PROT 7.5  ALBUMIN 4.5   No results for input(s): LIPASE, AMYLASE in the last 168 hours. No results for input(s): AMMONIA in the last 168 hours. Coagulation Profile: No results for input(s): INR, PROTIME in the last 168 hours. Cardiac Enzymes: No results for input(s): CKTOTAL,  CKMB, CKMBINDEX, TROPONINI in the last 168 hours. BNP (last 3 results) No results for input(s): PROBNP in the last 8760 hours. HbA1C: No results for input(s): HGBA1C in the last 72 hours. CBG: No results for input(s): GLUCAP in the last 168 hours. Lipid Profile: No results for input(s): CHOL, HDL, LDLCALC, TRIG, CHOLHDL, LDLDIRECT in the last 72 hours. Thyroid Function Tests: No results for input(s): TSH, T4TOTAL, FREET4, T3FREE, THYROIDAB in the last 72 hours. Anemia Panel: No results for input(s): VITAMINB12, FOLATE, FERRITIN, TIBC, IRON, RETICCTPCT in the last 72 hours. Urine analysis:    Component Value Date/Time   COLORURINE YELLOW (A) 11/04/2019 0806   APPEARANCEUR HAZY (A) 11/04/2019 0806   LABSPEC 1.018 11/04/2019 0806   PHURINE 5.0 11/04/2019 0806   GLUCOSEU NEGATIVE 11/04/2019 0806   HGBUR NEGATIVE 11/04/2019 0806   BILIRUBINUR NEGATIVE 11/04/2019 0806   KETONESUR NEGATIVE 11/04/2019 0806   PROTEINUR NEGATIVE 11/04/2019 0806   NITRITE NEGATIVE 11/04/2019 0806   LEUKOCYTESUR NEGATIVE 11/04/2019 0806    Radiological Exams on Admission: No results found. Data Reviewed for HPI: Relevant notes from primary care and specialist visits, past discharge summaries as available in EHR, including Care Everywhere. Prior diagnostic testing as pertinent to current admission diagnoses Updated medications and problem lists for reconciliation ED course, including vitals, labs, imaging, treatment and response to treatment Triage notes, nursing and pharmacy notes and ED provider's notes Notable results as noted above in HPI      Assessment and Plan: * Alcohol withdrawal delirium, acute, hyperactive (HCC) Phenobarbital  withdrawal protocol Ativan  as needed for additional sedation Thiamine  folate and MVI Close monitoring in PCU Neurologic checks with fall and aspiration precautions  Coronary artery disease, nonobstructive No acute  issues suspected Continue home aspirin , metoprolol , isosorbide , rosuvastatin   Hypertension BP elevated in the setting of acute alcohol withdrawal Continue home antihypertensives with diltiazem , lisinopril , metoprolol  Additional PRNs if needed  Chronic obstructive pulmonary disease (COPD) (HCC) Not acutely exacerbated DuoNebs as needed  Anxiety ADHD Continue home extended release Ativan  Continue home Adderall        DVT prophylaxis: Lovenox   Consults: none  Advance Care Planning:   Code Status: Full  Code   Family Communication: none  Disposition Plan: Back to previous home environment  Severity of Illness: The appropriate patient status for this patient is OBSERVATION. Observation status is judged to be reasonable and necessary in order to provide the required intensity of service to ensure the patient's safety. The patient's presenting symptoms, physical exam findings, and initial radiographic and laboratory data in the context of their medical condition is felt to place them at decreased risk for further clinical deterioration. Furthermore, it is anticipated that the patient will be medically stable for discharge from the hospital within 2 midnights of admission.   Author: Delayne LULLA Solian, MD 02/02/2024 9:24 PM  For on call review [email or web address].

## 2024-02-02 NOTE — Assessment & Plan Note (Signed)
 BP elevated in the setting of acute alcohol withdrawal Continue home antihypertensives with diltiazem , lisinopril , metoprolol  Additional PRNs if needed

## 2024-02-02 NOTE — ED Notes (Signed)
 Patient placed on 3L [place] due to O2 stats dropping between 85-86%.

## 2024-02-02 NOTE — Assessment & Plan Note (Signed)
 No acute issues suspected Continue home aspirin , metoprolol , isosorbide , rosuvastatin 

## 2024-02-02 NOTE — ED Provider Notes (Signed)
-----------------------------------------   8:32 PM on 02/02/2024 -----------------------------------------  I took over care of this patient from Dr. Rexford.  The patient awoke and was anxious and agitated appearing, requiring constant redirection.  He was found to be hypertensive as well.  His presentation was consistent with alcohol withdrawal.  I gave an additional 2 mg of Ativan .  However after an hour, his CIWA score was still over 20 so we gave an additional 4 mg of Ativan .  He remained somewhat agitated with elevated CIWA score and received an additional 2 mg of Ativan .  Subsequently, he has now become more calm and somewhat somnolent.  Heart rate has remained normal.  Blood pressure is still elevated.  Because of the amount of Ativan  he was requiring I consulted APP Rust-Chester from the ICU who evaluated the patient.  She advised that the patient was now stable for admission to stepdown under the hospitalist service.  I then consulted Dr. Cleatus from the hospitalist service; based on our discussion she agrees to evaluate the patient for admission.   Jacolyn Pae, MD 02/02/24 2034

## 2024-02-02 NOTE — Progress Notes (Signed)
 Asked by EDP to evaluate patient for ICU admission.  Patient presented with main complaint of alcohol withdrawal and associated delirium tremens requiring multiple doses of ativan  and a dose of Geodon  earlier in the day.  Upon bedside assessment, patient is sleeping comfortably with stable vitals on 2 L [place], not on vasopressor support. As labs and vitals are stable, and the patient is calm protecting his airway, he does not meet ICU admission criteria at this time. Would recommend considering Phenobarbital  taper if withdrawal not well controlled with Ativan . Please re-consult if needed in the future.       Jenita Ruth Rust-Chester, AGACNP-BC Acute Care Nurse Practitioner [facility]    [phone] / [phone] Please see Amion for pager details.

## 2024-02-02 NOTE — BH Assessment (Signed)
 Patient given IM medications for agitation. Psych Team will assess at later time.

## 2024-02-02 NOTE — Assessment & Plan Note (Addendum)
 ADHD Continue home extended release Ativan  Continue home Adderall

## 2024-02-02 NOTE — ED Triage Notes (Addendum)
 Pt BIB [facility] from home with symptoms agitation, screaming, and jerking sensations. Pt states he drinks a 5th a day and has not had anything since yesterday. Pt continues to scream out and hit wall. Pt is very disorganized and screams  knock me out, I need help, this is not withdrawal, I feel like I am dying, my head is swooshing. Pt unable to answer RNs questions.Provider at bedside.

## 2024-02-03 LAB — URINE DRUG SCREEN
Amphetamines: NEGATIVE
Barbiturates: POSITIVE — AB
Benzodiazepines: POSITIVE — AB
Cocaine: NEGATIVE
Fentanyl: NEGATIVE
Methadone Scn, Ur: NEGATIVE
Opiates: NEGATIVE
Tetrahydrocannabinol: NEGATIVE

## 2024-02-03 MED ORDER — LIDOCAINE 5 % EX PTCH
2.0000 | MEDICATED_PATCH | CUTANEOUS | Status: DC
  Administered 2024-02-03 – 2024-02-05 (×3): 2 via TRANSDERMAL
  Filled 2024-02-03 (×3): qty 2

## 2024-02-03 MED ORDER — NICOTINE 14 MG/24HR TD PT24
14.0000 mg | MEDICATED_PATCH | Freq: Every day | TRANSDERMAL | Status: DC
  Administered 2024-02-03 – 2024-02-05 (×3): 14 mg via TRANSDERMAL
  Filled 2024-02-03 (×3): qty 1

## 2024-02-03 MED ORDER — LISINOPRIL 20 MG PO TABS
20.0000 mg | ORAL_TABLET | Freq: Every day | ORAL | Status: DC
  Administered 2024-02-03 – 2024-02-05 (×3): 20 mg via ORAL
  Filled 2024-02-03 (×3): qty 1

## 2024-02-03 NOTE — Plan of Care (Signed)
   Problem: Activity: Goal: Risk for activity intolerance will decrease Outcome: Progressing   Problem: Nutrition: Goal: Adequate nutrition will be maintained Outcome: Progressing   Problem: Coping: Goal: Level of anxiety will decrease Outcome: Progressing   Problem: Elimination: Goal: Will not experience complications related to bowel motility Outcome: Progressing Goal: Will not experience complications related to urinary retention Outcome: Progressing

## 2024-02-03 NOTE — Progress Notes (Signed)
 Approximately 1500--Pt arrived to room 259 from ED. Upon arrival, VS obtained and assessment completed. Telemetry connected and notified. Pt accompanied by mother, at bedside. Fall precautions in place.

## 2024-02-03 NOTE — ED Notes (Signed)
Provided snack box and water 

## 2024-02-03 NOTE — ED Notes (Signed)
 VOL/Plan to Admit to medically

## 2024-02-03 NOTE — ED Notes (Signed)
 Pt has taken off monitoring cords multiple times. Pt educated again on importance of monitors.

## 2024-02-03 NOTE — Discharge Instructions (Signed)
 Please follow-up with a pain specialist and your PCP regarding your chronic pain.  Please do not drink alcohol as you are on multiple medications that can make you drowsy.  You were started on a medication called to compensate to help with your alcohol use disorder.  Use the resources below for your alcohol and any other substance use disorder.   Please follow up with your PCP to ensure you are on appropriate medications for your blood pressure.   Outpatient Substance Use Treatment Services   [facility] [address] [place], [place] [address]  [phone] Private insurance, Medicare A&B, and [facility]   [facility]  [address],  [place], [place] [address] [phone] Medicaid, Self Pay   [facility]      [address]  [place], [place] [phone] Medicaid and [facility], Self Pay   [facility] [address]  [place], [place]  [phone] [facility], and Self Pay  [facility]      [address]    [place], [place] [address]  [phone] or [phone] Private Insurance Only                 [facility] [address]  [place],   [phone] Medicaid, Medicare, Private Insurance  [facility] at [facility] [address]  [place], [place] [address] [phone] Services are free or reduced  [facility]  [address] [phone]  Self Pay only, sliding scale  [facility]  [address]  [place], [place] [address] [phone] ([facility]) Self Pay, Medicaid Only   [facility] [address], [place] [address] [phone] Medicaid, Medicare, Private Insurance

## 2024-02-03 NOTE — Consult Note (Signed)
 [facility] Psychiatric Consult Initial  Patient Name: .Theodore Garcia  MRN: [id number]  DOB: [birth year]  Consult Order details:  Orders (From admission, onward)     Start     Ordered   02/02/24 1322  IP CONSULT TO PSYCHIATRY       Ordering Provider: Rexford Reche HERO, MD  Provider:  (Not yet assigned)  Question Answer Comment  Reason for consult: Other (see comments)   Comments: Eval for agitiation      02/02/24 1322             Mode of Visit: In person    Psychiatry Consult Evaluation  Service Date: February 03, 2024 LOS:  LOS: 0 days  Chief Complaint I'm alright  Primary Psychiatric Diagnoses  Alcohol withdrawal delirium, acute, hyperactive   Assessment   Theodore Garcia is a 53 y.o. male admitted: Presented to the EDfor 02/02/2024  1:08 PM for agitation . He carries the psychiatric diagnoses of alcohol abuse and has a past medical history of  unknown.    The psychiatric evaluation was limited due to the patient repeatedly falling asleep throughout the assessment despite prompts to remain awake. As a result, a full and thorough exam could not be completed. During periods of alertness, the patient denied suicidal ideation or homicidal ideation. The patient also denied consuming alcohol with the intent to harm himself. He denied current auditory or visual hallucinations and expressed no interest in rehabilitation services at this time. Further assessment is recommended when the patient is fully awake and able to participate meaningfully in the evaluation.  Diagnoses:  Active Hospital problems: Principal Problem:   Alcohol withdrawal delirium, acute, hyperactive (HCC) Active Problems:   Hypertension   Chronic obstructive pulmonary disease (COPD) (HCC)   Coronary artery disease, nonobstructive   Anxiety    Plan   ## Psychiatric Medication Recommendations:  Agree with medical team utilizing CIWA protocol as well as as needed benzodiazepines for  agitation  ## Medical Decision Making Capacity: Not specifically addressed in this encounter  ## Further Work-up:   -- most recent EKG on 02/02/2024 had QtC of 464 -- Pertinent labwork reviewed earlier this admission includes: CMP, ethanol, CBC, acetaminophen  level, salicylate level, urine drug screen pending   ## Disposition:--Patient is medically being admitted at this time  ## Behavioral / Environmental: - No specific recommendations at this time.     ## Safety and Observation Level:  - Based on my clinical evaluation, I estimate the patient to be at low risk of self harm in the current setting. - At this time, we recommend  routine. This decision is based on my review of the chart including patient's history and current presentation, interview of the patient, mental status examination, and consideration of suicide risk including evaluating suicidal ideation, plan, intent, suicidal or self-harm behaviors, risk factors, and protective factors. This judgment is based on our ability to directly address suicide risk, implement suicide prevention strategies, and develop a safety plan while the patient is in the clinical setting. Please contact our team if there is a concern that risk level has changed.  CSSR Risk Category:C-SSRS RISK CATEGORY: No Risk  Suicide Risk Assessment: Patient has following modifiable risk factors for suicide: N/A, which we are addressing by N/A. Patient has following non-modifiable or demographic risk factors for suicide: male gender Patient has the following protective factors against suicide: no history of suicide attempts and no history of NSSIB  Thank you for this consult request. Recommendations have been  communicated to the primary team.  We will attempt to reassess when more alert at this time.  [name], NP       History of Present Illness  Relevant Aspects of Hospital ED   Patient Report:  The psychiatric evaluation was limited due to the patient  repeatedly falling asleep throughout the assessment despite prompts to remain awake. As a result, a full and thorough exam could not be completed. During periods of alertness, the patient denied suicidal ideation or homicidal ideation. The patient also denied consuming alcohol with the intent to harm himself. He denied current auditory or visual hallucinations and expressed no interest in rehabilitation services at this time. Further assessment is recommended when the patient is fully awake and able to participate meaningfully in the evaluation.  Psych ROS:  Depression: UTA Anxiety:  UTA Mania (lifetime and current): UTA Psychosis: (lifetime and current): UTA  Collateral information:  No collateral contact information provided at this time by patient    Psychiatric and Social History  Psychiatric History:  Information collected from Limited information obtained from patient  Prev Dx/Sx: Chart reviewed of EDP note showed history of alcohol use disorder Current Psych Provider: UTA Home Meds (current): UTA Previous Med Trials: UTA Therapy: UTA  Prior Psych Hospitalization: Denied Prior Self Harm: Denied Prior Violence: Denied  Family Psych History: UTA Family Hx suicide: UTA  Social History:   Educational Hx: UTA Occupational Hx: Chief Financial Officer Hx: UTA Living Situation: UTA Spiritual Hx: UTA Access to weapons/lethal means: Denied  Substance History Alcohol: Endorsed Type of alcohol UTA Last Drink reported night before arrival to emergency room Number of drinks per day UTA History of alcohol withdrawal seizures UTA History of DT's UTA Tobacco: UTA Illicit drugs: UTA Prescription drug abuse: UTA Rehab hx: UTA  Exam Findings  Physical Exam: Reviewed and agree with the physical exam findings conducted by the medical provider Vital Signs:  Temp:  [97.9 F (36.6 C)-98.2 F (36.8 C)] 98.1 F (36.7 C) (12/10 0800) Pulse Rate:  [74-105] 88 (12/10 1050) Resp:  [15-21] 19  (12/10 1050) BP: (154-181)/(84-121) 173/93 (12/10 0700) SpO2:  [89 %-98 %] 92 % (12/10 1050) Weight:  [99.8 kg] 99.8 kg (12/09 1320) Blood pressure (!) 173/93, pulse 88, temperature 98.1 F (36.7 C), temperature source Oral, resp. rate 19, height 6' (1.829 m), weight 99.8 kg, SpO2 92%. Body mass index is 29.84 kg/m.    Mental Status Exam: General Appearance: Fairly Groomed  Orientation:  Other:  UTA  Memory:  UTA  Concentration:  Concentration: Poor  Recall:  UTA  Attention  Poor  Eye Contact:  Minimal  Speech:  Slow  Language:  Fair  Volume:  Decreased  Mood: UTA  Affect:  UTA  Thought Process:  UTA  Thought Content:  UTA  Suicidal Thoughts:  No  Homicidal Thoughts:  No  Judgement:  Impaired  Insight:  UTA  Psychomotor Activity:  Normal  Akathisia:  No  Fund of Knowledge:  UTA      Assets:  Health And Safety Inspector Housing Physical Health  Cognition:  Impaired,  Mild  ADL's:  Intact  AIMS (if indicated):        Other History   These have been pulled in through the EMR, reviewed, and updated if appropriate.  Family History:  The patient's family history includes CAD in his mother; Heart Problems in his father; Heart disease in his mother; Heart murmur in his father; Prostate cancer in his maternal grandfather.  Medical History: Past Medical  History:  Diagnosis Date   ADHD (attention deficit hyperactivity disorder)    Anxiety    Asthma    Depression    Eczema    GERD (gastroesophageal reflux disease)    Hyperlipidemia    Hypertension    Osteoporosis    Seasonal allergies     Surgical History: Past Surgical History:  Procedure Laterality Date   BONE GRAFT HIP ILIAC CREST     left hip   COLONOSCOPY WITH PROPOFOL  N/A 12/10/2022   Procedure: COLONOSCOPY WITH PROPOFOL ;  Surgeon: Toledo, Ladell POUR, MD;  Location: [facility] ENDOSCOPY;  Service: Gastroenterology;  Laterality: N/A;   craniotomy     for closed fontanella   LEFT HEART CATH AND CORONARY  ANGIOGRAPHY Left 06/26/2021   Procedure: LEFT HEART CATH AND CORONARY ANGIOGRAPHY;  Surgeon: Florencio Cara BIRCH, MD;  Location: [facility] INVASIVE CV LAB;  Service: Cardiovascular;  Laterality: Left;   POLYPECTOMY  12/10/2022   Procedure: POLYPECTOMY;  Surgeon: Aundria, Ladell POUR, MD;  Location: [facility] ENDOSCOPY;  Service: Gastroenterology;;   TIBIA IM NAIL INSERTION Right 05/23/2015   Procedure: INTRAMEDULLARY (IM) NAIL TIBIAL;  Surgeon: Kayla Pinal, MD;  Location: [facility] ORS;  Service: Orthopedics;  Laterality: Right;   TOTAL HIP ARTHROPLASTY     Left hip   TOTAL HIP ARTHROPLASTY Right 11/08/2019   Procedure: TOTAL HIP ARTHROPLASTY ANTERIOR APPROACH;  Surgeon: Kathlynn Sharper, MD;  Location: [facility] ORS;  Service: Orthopedics;  Laterality: Right;   WISDOM TOOTH EXTRACTION     WRIST FRACTURE SURGERY     Right wrist     Medications:   Current Facility-Administered Medications:    acetaminophen  (TYLENOL ) tablet 650 mg, 650 mg, Oral, Q6H PRN **OR** acetaminophen  (TYLENOL ) suppository 650 mg, 650 mg, Rectal, Q6H PRN, Cleatus Delayne GAILS, MD   albuterol  (PROVENTIL ) (2.5 MG/3ML) 0.083% nebulizer solution 2.5 mg, 2.5 mg, Nebulization, Q2H PRN, Cleatus Delayne GAILS, MD   aspirin  EC tablet 81 mg, 81 mg, Oral, Daily, Cleatus Delayne GAILS, MD   diltiazem  (CARDIZEM  CD) 24 hr capsule 180 mg, 180 mg, Oral, Daily, Cleatus Delayne GAILS, MD   enoxaparin  (LOVENOX ) injection 40 mg, 40 mg, Subcutaneous, QHS, Cleatus Delayne GAILS, MD, 40 mg at 02/02/24 2223   fenofibrate  tablet 54 mg, 54 mg, Oral, Daily, Cleatus Delayne GAILS, MD   folic acid  (FOLVITE ) tablet 1 mg, 1 mg, Oral, Daily, Cleatus Delayne GAILS, MD   HYDROcodone -acetaminophen  (NORCO/VICODIN) 5-325 MG per tablet 1-2 tablet, 1-2 tablet, Oral, Q4H PRN, Cleatus Delayne GAILS, MD   isosorbide  mononitrate (IMDUR ) 24 hr tablet 30 mg, 30 mg, Oral, Daily, Cleatus Delayne GAILS, MD   LORazepam  (ATIVAN ) injection 0-4 mg, 0-4 mg, Intravenous, Q6H, 1 mg at 02/02/24 2119 **OR** LORazepam  (ATIVAN ) tablet 0-4 mg, 0-4 mg, Oral,  Q6H, Jacolyn Pae, MD, 1 mg at 02/03/24 0409   [START ON 02/05/2024] LORazepam  (ATIVAN ) injection 0-4 mg, 0-4 mg, Intravenous, Q12H **OR** [START ON 02/05/2024] LORazepam  (ATIVAN ) tablet 0-4 mg, 0-4 mg, Oral, Q12H, Jacolyn Pae, MD   LORazepam  (ATIVAN ) injection 1-2 mg, 1-2 mg, Intravenous, Q1H PRN, Cleatus Delayne GAILS, MD   metoprolol  succinate (TOPROL -XL) 24 hr tablet 25 mg, 25 mg, Oral, Daily, Duncan, Hazel V, MD   multivitamin with minerals tablet 1 tablet, 1 tablet, Oral, Daily, Cleatus Delayne GAILS, MD   nicotine  (NICODERM CQ  - dosed in mg/24 hours) patch 14 mg, 14 mg, Transdermal, Daily, Franchot Novel, MD   ondansetron  (ZOFRAN ) tablet 4 mg, 4 mg, Oral, Q6H PRN **OR** ondansetron  (ZOFRAN ) injection 4 mg, 4 mg, Intravenous, Q6H  PRN, Cleatus Delayne GAILS, MD   PHENObarbital  (LUMINAL) injection 65 mg, 65 mg, Intravenous, Q8H, 65 mg at 02/03/24 0411 **FOLLOWED BY** [START ON 02/04/2024] PHENObarbital  (LUMINAL) injection 32.5 mg, 32.5 mg, Intravenous, Q8H, Cleatus Delayne GAILS, MD   ranolazine  (RANEXA ) 12 hr tablet 1,000 mg, 1,000 mg, Oral, BID, Cleatus Delayne GAILS, MD   rosuvastatin  (CRESTOR ) tablet 40 mg, 40 mg, Oral, Daily, Duncan, Hazel V, MD   thiamine  (VITAMIN B1) 200 mg in sodium chloride  0.9 % 50 mL IVPB, 200 mg, Intravenous, Q24H, Stopped at 02/02/24 2247 **OR** thiamine  (VITAMIN B1) tablet 100 mg, 100 mg, Oral, Q24H, Cleatus Delayne GAILS, MD  Current Outpatient Medications:    albuterol  (VENTOLIN  HFA) 108 (90 Base) MCG/ACT inhaler, Inhale 2 puffs into the lungs every 6 (six) hours as needed for wheezing or shortness of breath., Disp: , Rfl:    ALPRAZolam  (XANAX  XR) 2 MG 24 hr tablet, Take 2 mg by mouth daily., Disp: , Rfl:    amphetamine -dextroamphetamine  (ADDERALL XR) 25 MG 24 hr capsule, Take 25 mg by mouth every morning., Disp: , Rfl:    amphetamine -dextroamphetamine  (ADDERALL) 10 MG tablet, Take 10 mg by mouth daily as needed (depends on work schedule)., Disp: , Rfl:    aspirin  EC 81 MG tablet,  Take 81 mg by mouth daily. Swallow whole., Disp: , Rfl:    diltiazem  (CARDIZEM  CD) 180 MG 24 hr capsule, Take 180 mg by mouth daily., Disp: , Rfl:    EPINEPHrine  0.3 mg/0.3 mL IJ SOAJ injection, Inject 0.3 mg into the muscle once as needed (for severe allergic reaction). , Disp: , Rfl:    fenofibrate  (TRICOR ) 48 MG tablet, Take 48 mg by mouth daily., Disp: , Rfl:    lidocaine  (LIDODERM ) 5 %, Place 1 patch onto the skin daily., Disp: , Rfl:    omega-3 acid ethyl esters (LOVAZA ) 1 g capsule, Take 2 g by mouth daily., Disp: , Rfl:    ranolazine  (RANEXA ) 1000 MG SR tablet, Take 1,000 mg by mouth 2 (two) times daily., Disp: , Rfl:    rosuvastatin  (CRESTOR ) 40 MG tablet, Take 40 mg by mouth daily., Disp: , Rfl:    tiZANidine (ZANAFLEX) 2 MG tablet, Take 2 mg by mouth every 8 (eight) hours as needed for muscle spasms., Disp: , Rfl:    ALPRAZolam  (XANAX ) 0.5 MG tablet, Take 0.5 mg by mouth 3 (three) times daily as needed for anxiety or sleep. (Patient not taking: Reported on 02/02/2024), Disp: , Rfl:    clopidogrel (PLAVIX) 75 MG tablet, Take 75 mg by mouth daily., Disp: , Rfl:    Dupilumab (DUPIXENT) 300 MG/2ML SOPN, Inject 300 mg into the skin every 14 (fourteen) days. (Patient not taking: Reported on 02/02/2024), Disp: , Rfl:    isosorbide  mononitrate (IMDUR ) 30 MG 24 hr tablet, Take 1 tablet (30 mg total) by mouth daily., Disp: 30 tablet, Rfl: 11   lisinopril  (ZESTRIL ) 20 MG tablet, Take 20 mg by mouth daily., Disp: , Rfl:    metoprolol  succinate (TOPROL -XL) 25 MG 24 hr tablet, Take 25 mg by mouth daily., Disp: , Rfl:    pimecrolimus (ELIDEL) 1 % cream, Apply 1 application topically 2 (two) times daily as needed (eczema)., Disp: , Rfl:    ranolazine  (RANEXA ) 500 MG 12 hr tablet, Take 500 mg by mouth daily. (Patient not taking: Reported on 02/02/2024), Disp: , Rfl:    Vitamin D, Ergocalciferol, (DRISDOL) 1.25 MG (50000 UNIT) CAPS capsule, Take 50,000 Units by mouth every 7 (seven) days. (Patient  not taking:  Reported on 02/02/2024), Disp: , Rfl:   Allergies: Allergies  Allergen Reactions   Cefazolin  Anaphylaxis   Clindamycin  Hcl Anaphylaxis    Throat closing/swelling/difficulty breathing   Penicillins Swelling, Rash and Other (See Comments)    Reaction:  Tongue swelling  Has patient had a PCN reaction causing immediate rash, facial/tongue/throat swelling, SOB or lightheadedness with hypotension: Yes Has patient had a PCN reaction causing severe rash involving mucus membranes or skin necrosis: No Has patient had a PCN reaction that required hospitalization No Has patient had a PCN reaction occurring within the last 10 years: No If all of the above answers are NO, then may proceed with Cephalosporin use.   [name], NP This note was created using Dragon dictation software. Please excuse any inadvertent transcription errors. Case was discussed with supervising physician Dr. Jadapalle who is agreeable with current plan.

## 2024-02-03 NOTE — ED Notes (Signed)
 Pt took self off monitor. Pt educated on importance of monitoring.

## 2024-02-03 NOTE — Progress Notes (Signed)
° °  Brief Progress Note   _____________________________________________________________________________________________________________  Patient Name: Theodore Garcia Patient DOB: 27-Jun-1970 Date: @TODAY @      Data: Reviewed labs, VS, notes, CIWA.    Action: No action needed at this time.      Response:    _____________________________________________________________________________________________________________  [facility] RN Expeditor Sharolyn JONETTA Batman Please contact us  directly via secure chat (search for [facility]) or by calling us  at [phone] [facility]).

## 2024-02-03 NOTE — ED Notes (Signed)
Breakfast tray delivered at bedside.

## 2024-02-03 NOTE — ED Notes (Signed)
 Pt unhooked and ambulatory to bathroom

## 2024-02-03 NOTE — ED Notes (Signed)
 Pt at bedside to answer call bell. Pt given remote and water.

## 2024-02-03 NOTE — TOC Initial Note (Signed)
 Transition of Care [facility]) - Initial/Assessment Note    Patient Details  Name: Theodore Garcia MRN: [id number] Date of Birth: 04-08-1970  [facility] CM/SW Contact:    Julianah Marciel L Concha Sudol, LCSW Phone Number: 02/03/2024, 8:26 AM  Clinical Narrative:                  [facility] consult received. Substance abuse resources added to the AVS       Patient Goals and CMS Choice            Expected Discharge Plan and Services                                              Prior Living Arrangements/Services                       Activities of Daily Living      Permission Sought/Granted                  Emotional Assessment              Admission diagnosis:  Alcohol withdrawal delirium, acute, hyperactive (HCC) [F10.931] Patient Active Problem List   Diagnosis Date Noted   Alcohol withdrawal delirium, acute, hyperactive (HCC) 02/02/2024   Chronic obstructive pulmonary disease (COPD) (HCC) 02/02/2024   Coronary artery disease, nonobstructive 02/02/2024   Hypertension    Anxiety    Closed right tibial fracture 05/23/2015   Tibial fracture 05/16/2015   PCP:  Valora Lynwood FALCON, MD Pharmacy:   [facility] - [place], [place] - [address] [place] [address] Phone: [phone] Fax: [phone]     Social Drivers of Health (SDOH) Social History: SDOH Screenings   Food Insecurity: No Food Insecurity (01/14/2024)   Received from [facility]  Recent Concern: Food Insecurity - Food Insecurity Present (11/18/2023)   Received from [facility]  Housing: Low Risk  (01/14/2024)   Received from [facility]  Transportation Needs: No Transportation Needs (01/14/2024)   Received [facility]  Recent Concern: Transportation Needs - Unmet Transportation Needs (11/18/2023)   Received from [facility]  Utilities: Not At Risk  (01/14/2024)   Received from [facility]  Financial Resource Strain: Low Risk  (01/14/2024)   Received from [facility]  Recent Concern: Financial Resource Strain - High Risk (11/18/2023)   Received from [facility]  Tobacco Use: High Risk (01/29/2024)   Received from [facility]   SDOH Interventions:     Readmission Risk Interventions     No data to display

## 2024-02-03 NOTE — Progress Notes (Signed)
 Progress Note   Patient: Theodore Garcia FMW:[id number] DOB: October 12, 1970 DOA: 02/02/2024     0 DOS: the patient was seen and examined on 02/03/2024   Brief hospital course:  Per H&P HPI: Theodore Garcia is a 53 y.o. male with medical history significant for HTN, anxiety, COPD, nonobstructive CAD, alcohol use disorder, being admitted with acute alcohol withdrawal delirium, with unmanageable symptoms after multiple doses of Ativan  as well as Geodon  in the emergency room.  He was brought in by EMS with agitation, screaming and jerking sensations.  States that he drinks about 1/5 a day.  ICU was called to evaluate for admission but at the time of the assessment patient had started to improve with treatment and medicine admission requested. In the ED was hypertensive and tachycardic with otherwise normal vitals.  Labs notable for EtOH of 96, blood glucose 253 with anion gap 18 and bicarb 21. As mentioned, treated with Geodon  and Ativan . Admission requested   Assessment and Plan: * Alcohol withdrawal delirium, acute, hyperactive (HCC) Phenobarbital  withdrawal protocol Ativan  as needed for additional sedation Thiamine  folate and MVI Close monitoring in PCU   Coronary artery disease, nonobstructive No acute issues suspected Continue home aspirin , metoprolol , isosorbide , rosuvastatin   Hypertension BP elevated in the setting of acute alcohol withdrawal Continue home antihypertensives with diltiazem , lisinopril , metoprolol  Additional PRNs if needed  Chronic obstructive pulmonary disease (COPD) (HCC) Not acutely exacerbated DuoNebs as needed  Anxiety ADHD Holding home adderall and xanax          Subjective: Patient having some lower back pain, but has been able to ambulate without difficulty to the toilet.   Physical Exam: Vitals:   02/03/24 1445 02/03/24 1445 02/03/24 1639 02/03/24 1700  BP: (!) 144/100 (!) 144/100 (!) 152/88   Pulse: 82 82 79   Resp:  18  20  Temp:  98.9 F  (37.2 C)    TempSrc:      SpO2:  94% 97%   Weight:      Height:       Physical Exam  Constitutional: In no distress.  Cardiovascular: Normal rate, regular rhythm. No lower extremity edema  Pulmonary: Non labored breathing on room air, no wheezing or rales.   Abdominal: Soft. Non distended and non tender Musculoskeletal: Normal range of motion.     Neurological: Alert and oriented to person, place, and time. Non focal  Skin: Skin is warm and dry.   Data Reviewed:     Latest Ref Rng & Units 02/02/2024    1:32 PM 11/20/2023   11:01 AM 11/04/2019    8:06 AM  CBC  WBC 4.0 - 10.5 K/uL 9.6  5.9  8.8   Hemoglobin 13.0 - 17.0 g/dL 85.6  84.5  85.3   Hematocrit 39.0 - 52.0 % 41.8  44.9  41.9   Platelets 150 - 400 K/uL 234  139  231       Latest Ref Rng & Units 02/02/2024    1:32 PM 11/20/2023   11:01 AM 03/04/2021    8:27 AM  BMP  Glucose 70 - 99 mg/dL 746  876    BUN 6 - 20 mg/dL 25  25    Creatinine 9.38 - 1.24 mg/dL 9.14  8.75  8.99   Sodium 135 - 145 mmol/L 136  140    Potassium 3.5 - 5.1 mmol/L 4.2  3.7    Chloride 98 - 111 mmol/L 98  105    CO2 22 - 32 mmol/L 21  24    Calcium  8.9 - 10.3 mg/dL 9.6  89.9       Family Communication: none  Disposition: Status is: Inpatient Remains inpatient appropriate because: alcohol withdrawal.   Planned Discharge Destination: Pending PT/OT     Time spent: 35 minutes  Author: Alban Pepper, MD 02/03/2024 5:45 PM  For on call review [email or web address].

## 2024-02-04 LAB — BASIC METABOLIC PANEL WITH GFR
Anion gap: 13 (ref 5–15)
BUN: 20 mg/dL (ref 6–20)
CO2: 25 mmol/L (ref 22–32)
Calcium: 9.2 mg/dL (ref 8.9–10.3)
Chloride: 96 mmol/L — ABNORMAL LOW (ref 98–111)
Creatinine, Ser: 0.92 mg/dL (ref 0.61–1.24)
GFR, Estimated: >60 mL/min (ref >60)
Glucose, Bld: 105 mg/dL — ABNORMAL HIGH (ref 70–99)
Potassium: 3.9 mmol/L (ref 3.5–5.1)
Sodium: 133 mmol/L — ABNORMAL LOW (ref 135–145)

## 2024-02-04 LAB — HIV ANTIBODY (ROUTINE TESTING W REFLEX): HIV Screen 4th Generation wRfx: NONREACTIVE

## 2024-02-04 LAB — CBC
HCT: 41.4 % (ref 39.0–52.0)
Hemoglobin: 14 g/dL (ref 13.0–17.0)
MCH: 33.6 pg (ref 26.0–34.0)
MCHC: 33.8 g/dL (ref 30.0–36.0)
MCV: 99.3 fL (ref 80.0–100.0)
Platelets: 213 K/uL (ref 150–400)
RBC: 4.17 MIL/uL — ABNORMAL LOW (ref 4.22–5.81)
RDW: 13 % (ref 11.5–15.5)
WBC: 9.7 K/uL (ref 4.0–10.5)
nRBC: 0 % (ref 0.0–0.2)

## 2024-02-04 LAB — HEMOGLOBIN A1C
Hgb A1c MFr Bld: 6.3 % — ABNORMAL HIGH (ref 4.8–5.6)
Mean Plasma Glucose: 134.11 mg/dL

## 2024-02-04 MED ORDER — ACAMPROSATE CALCIUM 333 MG PO TBEC
666.0000 mg | DELAYED_RELEASE_TABLET | Freq: Three times a day (TID) | ORAL | Status: DC
  Administered 2024-02-05 (×2): 666 mg via ORAL
  Filled 2024-02-04 (×3): qty 2

## 2024-02-04 NOTE — Evaluation (Signed)
 Physical Therapy Evaluation Patient Details Name: Theodore Garcia MRN: [id number] DOB: October 22, 1970 Today's Date: 02/04/2024  History of Present Illness  Pt is a 53 y.o. male with medical history significant for HTN, anxiety, COPD, nonobstructive CAD, and alcohol use disorder, being admitted with acute alcohol withdrawal delirium with unmanageable symptoms. MD assessment includes: alcohol withdrawal delirium and anxiety.   Clinical Impression  Pt was pleasant and motivated to participate during the session and put forth good effort throughout. Pt required no physical assistance during the session and was generally steady with standing activities with no overt LOB.  Pt was able to amb 150 feet split between a RW initially followed by no AD with slow cadence and good stability without the RW including during head turns and start/stops.  Pt declined further amb than 150 total feet due to fatigue but with SpO2 and HR WNL and no adverse symptoms noted.  Pt declining follow up PT services to address deficits in activity tolerance stating that he would be able to get back to his baseline independently.  Pt will benefit from continued PT services while in acute care to address deficits listed in patient problem list for decreased caregiver assistance and eventual return to PLOF.          If plan is discharge home, recommend the following: A little help with walking and/or transfers;Assist for transportation;Help with stairs or ramp for entrance   Can travel by private vehicle        Equipment Recommendations None recommended by PT  Recommendations for Other Services       Functional Status Assessment Patient has had a recent decline in their functional status and demonstrates the ability to make significant improvements in function in a reasonable and predictable amount of time.     Precautions / Restrictions Precautions Precautions: Fall Restrictions Weight Bearing Restrictions Per Provider  Order: No      Mobility  Bed Mobility Overal bed mobility: Independent             General bed mobility comments: Good speed and effort with no use of the bed rails needed    Transfers Overall transfer level: Needs assistance Equipment used: None Transfers: Sit to/from Stand Sit to Stand: Supervision           General transfer comment: Good eccentric and concentric control and stability    Ambulation/Gait Ambulation/Gait assistance: Supervision Gait Distance (Feet): 150 Feet (75' with RW, 76' without AD) Assistive device: None, Rolling walker (2 wheels) Gait Pattern/deviations: Step-through pattern, Decreased step length - right, Decreased step length - left Gait velocity: decreased     General Gait Details: Mildly reduced cadence with no overt LOB both with a RW initially and then without an AD  Stairs            Wheelchair Mobility     Tilt Bed    Modified Rankin (Stroke Patients Only)       Balance Overall balance assessment: No apparent balance deficits (not formally assessed)                                           Pertinent Vitals/Pain Pain Assessment Pain Assessment: 0-10 Pain Score: 7  Pain Location: L knee and back Pain Descriptors / Indicators: Aching, Sore Pain Intervention(s): Monitored during session, Premedicated before session, Repositioned    Home Living Family/patient expects to be discharged  to:: Private residence Living Arrangements: Non-relatives/Friends Available Help at Discharge: Friend(s);Available 24 hours/day Type of Home: House Home Access: Stairs to enter Entrance Stairs-Rails: Left Entrance Stairs-Number of Steps: 3   Home Layout: One level Home Equipment: Agricultural [employer]ant (2 wheels);Cane - single point      Prior Function Prior Level of Function : Independent/Modified Independent;History of Falls (last six months)             Mobility Comments: Ind amb community distances without  an AD, 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, walks 4 blocks to/from work, 2 falls in the last 6 months due to tripping ADLs Comments: Ind with ADLs     Extremity/Trunk Assessment   Upper Extremity Assessment Upper Extremity Assessment: Overall WFL for tasks assessed    Lower Extremity Assessment Lower Extremity Assessment: Overall WFL for tasks assessed       Communication   Communication Communication: No apparent difficulties    Cognition Arousal: Alert Behavior During Therapy: WFL for tasks assessed/performed   PT - Cognitive impairments: No apparent impairments                         Following commands: Intact       Cueing Cueing Techniques: Verbal cues     General Comments      Exercises     Assessment/Plan    PT Assessment Patient needs continued PT services  PT Problem List Decreased activity tolerance;Pain       PT Treatment Interventions Gait training;Therapeutic exercise;Therapeutic activities    PT Goals (Current goals can be found in the Care Plan section)  Acute Rehab PT Goals Patient Stated Goal: To get home PT Goal Formulation: With patient Time For Goal Achievement: 02/17/24 Potential to Achieve Goals: Good    Frequency Min 1X/week     Co-evaluation               AM-PAC PT 6 Clicks Mobility  Outcome Measure Help needed turning from your back to your side while in a flat bed without using bedrails?: None Help needed moving from lying on your back to sitting on the side of a flat bed without using bedrails?: None Help needed moving to and from a bed to a chair (including a wheelchair)?: None Help needed standing up from a chair using your arms (e.g., wheelchair or bedside chair)?: None Help needed to walk in hospital room?: A Little Help needed climbing 3-5 steps with a railing? : A Little 6 Click Score: 22    End of Session Equipment Utilized During Treatment: Gait belt Activity Tolerance: Patient tolerated treatment  well Patient left: in bed;with call bell/phone within reach;with bed alarm set;Other (comment) (Pt declined up in chair, fall mat in place as found) Nurse Communication: Mobility status PT Visit Diagnosis: History of falling (Z91.81);Difficulty in walking, not elsewhere classified (R26.2);Pain Pain - Right/Left: Left Pain - part of body: Knee    Time: 8970-8944 PT Time Calculation (min) (ACUTE ONLY): 26 min   Charges:   PT Evaluation $PT Eval Low Complexity: 1 Low   PT General Charges $$ ACUTE PT VISIT: 1 Visit       D. Scott Mahum Betten PT, DPT 02/04/2024, 12:02 PM

## 2024-02-04 NOTE — Plan of Care (Signed)

## 2024-02-04 NOTE — Progress Notes (Signed)
 Progress Note   Patient: Theodore Garcia FMW:[id number] DOB: 01/14/1971 DOA: 02/02/2024     1 DOS: the patient was seen and examined on 02/04/2024   Brief hospital course:  Per H&P HPI: Theodore Garcia is a 53 y.o. male with medical history significant for HTN, anxiety, COPD, nonobstructive CAD, alcohol use disorder, being admitted with acute alcohol withdrawal delirium, with unmanageable symptoms after multiple doses of Ativan  as well as Geodon  in the emergency room.  He was brought in by EMS with agitation, screaming and jerking sensations.  States that he drinks about 1/5 a day.  ICU was called to evaluate for admission but at the time of the assessment patient had started to improve with treatment and medicine admission requested. In the ED was hypertensive and tachycardic with otherwise normal vitals.  Labs notable for EtOH of 96, blood glucose 253 with anion gap 18 and bicarb 21. As mentioned, treated with Geodon  and Ativan . Admission requested   Assessment and Plan: * Alcohol withdrawal delirium, acute, hyperactive (HCC) Phenobarbital  withdrawal protocol Ativan  as needed for additional sedation Thiamine  folate and MVI Will start acamprosate  Close monitoring in PCU   Coronary artery disease, nonobstructive No acute issues suspected Continue home aspirin , metoprolol , isosorbide , rosuvastatin   Hypertension BP elevated in the setting of acute alcohol withdrawal Continue home antihypertensives with diltiazem , lisinopril , metoprolol  Additional PRNs if needed  Chronic obstructive pulmonary disease (COPD) (HCC) Not acutely exacerbated DuoNebs as needed  Anxiety ADHD Holding home adderall and xanax          Subjective: Patient having some lower back pain, but has been able to ambulate without difficulty to the toilet.   Physical Exam: Vitals:   02/04/24 0930 02/04/24 1028 02/04/24 1142 02/04/24 1558  BP:  (!) 168/104 (!) 152/94 (!) 138/95  Pulse:  93 83 88  Resp: 18  20 14 19   Temp:  97.9 F (36.6 C) 98.3 F (36.8 C) 98.1 F (36.7 C)  TempSrc:  Oral Oral Oral  SpO2:  95% 96% 95%  Weight:      Height:       Physical Exam  Constitutional: In no distress.  Cardiovascular: Normal rate, regular rhythm. No lower extremity edema  Pulmonary: Non labored breathing on room air, no wheezing or rales.   Abdominal: Soft. Non distended and non tender Musculoskeletal: Normal range of motion.     Neurological: Alert and oriented to person, place, and time. Non focal  Skin: Skin is warm and dry.    Data Reviewed:     Latest Ref Rng & Units 02/04/2024    5:21 AM 02/02/2024    1:32 PM 11/20/2023   11:01 AM  CBC  WBC 4.0 - 10.5 K/uL 9.7  9.6  5.9   Hemoglobin 13.0 - 17.0 g/dL 85.9  85.6  84.5   Hematocrit 39.0 - 52.0 % 41.4  41.8  44.9   Platelets 150 - 400 K/uL 213  234  139       Latest Ref Rng & Units 02/04/2024    5:21 AM 02/02/2024    1:32 PM 11/20/2023   11:01 AM  BMP  Glucose 70 - 99 mg/dL 894  746  876   BUN 6 - 20 mg/dL 20  25  25    Creatinine 0.61 - 1.24 mg/dL 9.07  9.14  8.75   Sodium 135 - 145 mmol/L 133  136  140   Potassium 3.5 - 5.1 mmol/L 3.9  4.2  3.7   Chloride 98 -  111 mmol/L 96  98  105   CO2 22 - 32 mmol/L 25  21  24    Calcium  8.9 - 10.3 mg/dL 9.2  9.6  89.9      Family Communication: none  Disposition: Status is: Inpatient Remains inpatient appropriate because: alcohol withdrawal.   Planned Discharge Destination: Pending PT/OT     Time spent: 35 minutes  Author: Alban Pepper, MD 02/04/2024 7:26 PM  For on call review [email or web address].

## 2024-02-05 ENCOUNTER — Other Ambulatory Visit: Payer: Self-pay

## 2024-02-05 DIAGNOSIS — F10931 Alcohol use, unspecified with withdrawal delirium: Secondary | ICD-10-CM | POA: Diagnosis not present

## 2024-02-05 LAB — BASIC METABOLIC PANEL WITH GFR
Anion gap: 12 (ref 5–15)
BUN: 22 mg/dL — ABNORMAL HIGH (ref 6–20)
CO2: 24 mmol/L (ref 22–32)
Calcium: 9.7 mg/dL (ref 8.9–10.3)
Chloride: 98 mmol/L (ref 98–111)
Creatinine, Ser: 1.08 mg/dL (ref 0.61–1.24)
GFR, Estimated: >60 mL/min (ref >60)
Glucose, Bld: 122 mg/dL — ABNORMAL HIGH (ref 70–99)
Potassium: 3.9 mmol/L (ref 3.5–5.1)
Sodium: 134 mmol/L — ABNORMAL LOW (ref 135–145)

## 2024-02-05 MED ORDER — ACETAMINOPHEN 325 MG PO TABS: 650.0000 mg | ORAL_TABLET | Freq: Four times a day (QID) | ORAL | Status: AC | PRN

## 2024-02-05 MED ORDER — OXYCODONE HCL 5 MG PO TABS: 2.5000 mg | ORAL_TABLET | Freq: Three times a day (TID) | ORAL | 0 refills | Status: DC | PRN

## 2024-02-05 MED ORDER — ACAMPROSATE CALCIUM 333 MG PO TBEC: 666.0000 mg | DELAYED_RELEASE_TABLET | Freq: Three times a day (TID) | ORAL | 0 refills | Status: AC

## 2024-02-05 MED ORDER — THIAMINE HCL 100 MG/ML IJ SOLN: 100.0000 mg | INTRAMUSCULAR | Status: DC

## 2024-02-05 MED ORDER — THIAMINE MONONITRATE 100 MG PO TABS: 100.0000 mg | ORAL_TABLET | ORAL | Status: DC

## 2024-02-05 MED ORDER — ACAMPROSATE CALCIUM 333 MG PO TBEC
666.0000 mg | DELAYED_RELEASE_TABLET | Freq: Three times a day (TID) | ORAL | 0 refills | Status: DC
  Filled 2024-02-05: qty 180, 30d supply, fill #0

## 2024-02-05 MED ORDER — OXYCODONE HCL 5 MG PO TABS: 2.5000 mg | ORAL_TABLET | Freq: Three times a day (TID) | ORAL | 0 refills | Status: AC | PRN

## 2024-02-05 NOTE — Consult Note (Signed)
 [facility] Psychiatric Consult Follow Up  Patient Name: .Theodore Garcia  MRN: [id number]  DOB: [birth year]  Consult Order details:  Orders (From admission, onward)     Start     Ordered   02/02/24 1322  IP CONSULT TO PSYCHIATRY       Ordering Provider: Rexford Reche HERO, MD  Provider:  (Not yet assigned)  Question Answer Comment  Reason for consult: Other (see comments)   Comments: Eval for agitiation      02/02/24 1322             Mode of Visit: In person    Psychiatry Consult Evaluation  Service Date: February 05, 2024 LOS:  LOS: 2 days  Chief Complaint I'm alright  Primary Psychiatric Diagnoses  Alcohol withdrawal delirium, acute, hyperactive   Assessment   Theodore Garcia is a 53 y.o. male admitted: Presented to the EDfor 02/02/2024  1:08 PM for agitation . He carries the psychiatric diagnoses of alcohol abuse and has a past medical history of  unknown.    The psychiatric evaluation was limited due to the patient repeatedly falling asleep throughout the assessment despite prompts to remain awake. As a result, a full and thorough exam could not be completed. During periods of alertness, the patient denied suicidal ideation or homicidal ideation. The patient also denied consuming alcohol with the intent to harm himself. He denied current auditory or visual hallucinations and expressed no interest in rehabilitation services at this time. Further assessment is recommended when the patient is fully awake and able to participate meaningfully in the evaluation.  12/12: Patient denies current suicidal ideation, homicidal ideation, auditory hallucinations, or visual hallucinations. Patient denies drinking alcohol in an attempt to harm himself and reports his alcohol use is to help manage chronic pain, which he has been experiencing for many months to over a year. Patient denies any previous suicide attempts or self-harm. He endorses some depressive symptoms, which he  attributes primarily to his physical limitations and chronic pain, including inability to participate in previously enjoyed activities such as golfing. 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 He tried gabapentin  in the past without benefit. Patient denies current anxiety and denies other recreational drug use. He is a former tobacco user but has since quit.  Patient alert and oriented x4. Appropriately engaged in conversation with this provider. Thought process logical, coherent, and goal-oriented. No evidence of mania, psychosis, or response to internal stimuli on current presentation.  On current presentation there was no evidence of psychosis or mania and patient did not appear to be responding to internal stimuli. At this time, patient does not appear to be a risk to self or others.While future psychiatric events cannot be accurately predicted, the patient does not currently require acute inpatient psychiatric care and does not currently meet [place]  involuntary commitment criteria.   Patient declined interest in outpatient substance use resources at this time.  Patient was willing to discuss possible referral to pain management clinic with medical team.  He reported that the medical doctor had mentioned this to him prior.  Psychiatry was consulted initially due to agitation, likely associated with withdrawal symptoms.  Patient overall presentation seems to have improved.  At this time, psychiatry will sign off.  Please reach out or reconsult for any new concerns.  Diagnoses:  Active Hospital problems: Principal Problem:   Alcohol withdrawal delirium, acute, hyperactive (HCC) Active Problems:   Hypertension  Chronic obstructive pulmonary disease (COPD) (HCC)   Coronary artery disease, nonobstructive   Anxiety    Plan   ## Psychiatric Medication Recommendations:  Agree with medical team utilizing CIWA protocol as well  as as needed benzodiazepines for agitation  ## Medical Decision Making Capacity: Not specifically addressed in this encounter  ## Further Work-up:   -- most recent EKG on 02/02/2024 had QtC of 464 -- Pertinent labwork reviewed earlier this admission includes: CMP, ethanol, CBC, acetaminophen  level, salicylate level, urine drug screen pending   ## Disposition:--Patient is medically being admitted at this time  ## Behavioral / Environmental: - No specific recommendations at this time.     ## Safety and Observation Level:  - Based on my clinical evaluation, I estimate the patient to be at low risk of self harm in the current setting. - At this time, we recommend  routine. This decision is based on my review of the chart including patient's history and current presentation, interview of the patient, mental status examination, and consideration of suicide risk including evaluating suicidal ideation, plan, intent, suicidal or self-harm behaviors, risk factors, and protective factors. This judgment is based on our ability to directly address suicide risk, implement suicide prevention strategies, and develop a safety plan while the patient is in the clinical setting. Please contact our team if there is a concern that risk level has changed.  CSSR Risk Category:C-SSRS RISK CATEGORY: No Risk  Suicide Risk Assessment: Patient has following modifiable risk factors for suicide: N/A, which we are addressing by N/A. Patient has following non-modifiable or demographic risk factors for suicide: male gender Patient has the following protective factors against suicide: no history of suicide attempts and no history of NSSIB  Thank you for this consult request. Recommendations have been communicated to the primary team.  We will sign off at this time.  [name], NP       History of Present Illness  Relevant Aspects of Hospital ED   Patient Report:  The psychiatric evaluation was limited due to the  patient repeatedly falling asleep throughout the assessment despite prompts to remain awake. As a result, a full and thorough exam could not be completed. During periods of alertness, the patient denied suicidal ideation or homicidal ideation. The patient also denied consuming alcohol with the intent to harm himself. He denied current auditory or visual hallucinations and expressed no interest in rehabilitation services at this time. Further assessment is recommended when the patient is fully awake and able to participate meaningfully in the evaluation.  12/12: Patient denies current suicidal ideation, homicidal ideation, auditory hallucinations, or visual hallucinations. Patient denies drinking alcohol in an attempt to harm himself and reports his alcohol use is to help manage chronic pain, which he has been experiencing for many months to over a year. Patient denies any previous suicide attempts or self-harm. He endorses some depressive symptoms, which he attributes primarily to his physical limitations and chronic pain, including inability to participate in previously enjoyed activities such as golfing. 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 He tried gabapentin  in the past without benefit. Patient denies current anxiety and denies other recreational drug use. He is a former tobacco user but has since quit.  Patient alert and oriented x4. Appropriately engaged in conversation with this provider. Thought process logical, coherent, and goal-oriented. No evidence of mania, psychosis, or response to internal stimuli on current presentation.  On current presentation there  was no evidence of psychosis or mania and patient did not appear to be responding to internal stimuli. At this time, patient does not appear to be a risk to self or others.While future psychiatric events cannot be accurately predicted, the patient does not currently require  acute inpatient psychiatric care and does not currently meet [place]  involuntary commitment criteria.   Patient declined interest in outpatient substance use resources at this time.  Patient was willing to discuss possible referral to pain management clinic with medical team.  He reported that the medical doctor had mentioned this to him prior.  Psychiatry was consulted initially due to agitation, likely associated with withdrawal symptoms.  Patient overall presentation seems to have improved.  At this time, psychiatry will sign off.  Please reach out or reconsult for any new concerns.  Psych ROS:  Depression: Related to physical pain Anxiety:  Denied Mania (lifetime and current): Denied Psychosis: (lifetime and current): Denied      Psychiatric and Social History  Psychiatric History:  Information collected from Patient  Prev Dx/Sx: Chart reviewed of EDP note showed history of alcohol use disorder Current Psych Provider: None Home Meds (current): None Previous Med Trials: Gabapentin  Therapy: Denied  Prior Psych Hospitalization: Endorsed one prior many years ago related to alcohol use Prior Self Harm: Denied Prior Violence: Denied  Family Psych History: UTA Family Hx suicide: Denied Social History:    Occupational Hx: Bartends part time [employer] Officer Hx: Denied Living Situation: Alone Access to weapons/lethal means: Denied  Substance History Alcohol: Endorsed Last Drink reported night before arrival to emergency room History of alcohol withdrawal seizures Denied History of DT's Endorsed Tobacco: Previous, not currently Illicit drugs: Denied Prescription drug abuse: Denied Rehab hx: Denied  Exam Findings  Physical Exam: Reviewed and agree with the physical exam findings conducted by the medical provider Vital Signs:  Temp:  [97.6 F (36.4 C)-98.4 F (36.9 C)] 98 F (36.7 C) (12/12 1204) Pulse Rate:  [72-98] 87 (12/12 1204) Resp:  [18-20] 19 (12/12 1204) BP:  (132-148)/(85-97) 133/85 (12/12 1204) SpO2:  [94 %-99 %] 99 % (12/12 1204) Blood pressure 133/85, pulse 87, temperature 98 F (36.7 C), temperature source Oral, resp. rate 19, height 6' (1.829 m), weight 99.8 kg, SpO2 99%. Body mass index is 29.84 kg/m.    Mental Status Exam: General Appearance: Fairly Groomed  Orientation:  oriented x4  Memory:  Good  Concentration:  Good  Recall:  Good  Attention  Good  Eye Contact:  Good  Speech:  Normal rate  Language:  Fair  Volume:  Normal  Mood: Euthymic  Affect:  Congruent  Thought Process:  Logical  Thought Content:  WNL  Suicidal Thoughts:  No  Homicidal Thoughts:  No  Judgement:  Impaired  Insight:  Lacking  Psychomotor Activity:  Normal  Akathisia:  No  Fund of Knowledge:  UTA      Assets:  Financial Resources/Insurance Housing Physical Health  Cognition:  WNL  ADL's:  Intact  AIMS (if indicated):        Other History   These have been pulled in through the EMR, reviewed, and updated if appropriate.  Family History:  The patient's family history includes CAD in his mother; Heart Problems in his father; Heart disease in his mother; Heart murmur in his father; Prostate cancer in his maternal grandfather.  Medical History: Past Medical History:  Diagnosis Date   ADHD (attention deficit hyperactivity disorder)    Anxiety    Asthma  Depression    Eczema    GERD (gastroesophageal reflux disease)    Hyperlipidemia    Hypertension    Osteoporosis    Seasonal allergies     Surgical History: Past Surgical History:  Procedure Laterality Date   BONE GRAFT HIP ILIAC CREST     left hip   COLONOSCOPY WITH PROPOFOL  N/A 12/10/2022   Procedure: COLONOSCOPY WITH PROPOFOL ;  Surgeon: Toledo, Ladell POUR, MD;  Location: [facility] ENDOSCOPY;  Service: Gastroenterology;  Laterality: N/A;   craniotomy     for closed fontanella   LEFT HEART CATH AND CORONARY ANGIOGRAPHY Left 06/26/2021   Procedure: LEFT HEART CATH AND CORONARY  ANGIOGRAPHY;  Surgeon: Florencio Cara BIRCH, MD;  Location: [facility] INVASIVE CV LAB;  Service: Cardiovascular;  Laterality: Left;   POLYPECTOMY  12/10/2022   Procedure: POLYPECTOMY;  Surgeon: Aundria, Ladell POUR, MD;  Location: [facility] ENDOSCOPY;  Service: Gastroenterology;;   TIBIA IM NAIL INSERTION Right 05/23/2015   Procedure: INTRAMEDULLARY (IM) NAIL TIBIAL;  Surgeon: Kayla Pinal, MD;  Location: [facility] ORS;  Service: Orthopedics;  Laterality: Right;   TOTAL HIP ARTHROPLASTY     Left hip   TOTAL HIP ARTHROPLASTY Right 11/08/2019   Procedure: TOTAL HIP ARTHROPLASTY ANTERIOR APPROACH;  Surgeon: Kathlynn Sharper, MD;  Location: [facility] ORS;  Service: Orthopedics;  Laterality: Right;   WISDOM TOOTH EXTRACTION     WRIST FRACTURE SURGERY     Right wrist     Medications:   Current Facility-Administered Medications:    acamprosate (CAMPRAL) tablet 666 mg, 666 mg, Oral, TID WC, Franchot Novel, MD, 666 mg at 02/05/24 1240   acetaminophen  (TYLENOL ) tablet 650 mg, 650 mg, Oral, Q6H PRN **OR** acetaminophen  (TYLENOL ) suppository 650 mg, 650 mg, Rectal, Q6H PRN, Cleatus Delayne GAILS, MD   albuterol  (PROVENTIL ) (2.5 MG/3ML) 0.083% nebulizer solution 2.5 mg, 2.5 mg, Nebulization, Q2H PRN, Cleatus Delayne V, MD, 2.5 mg at 02/03/24 1710   aspirin  EC tablet 81 mg, 81 mg, Oral, Daily, Cleatus Delayne V, MD, 81 mg at 02/05/24 9078   diltiazem  (CARDIZEM  CD) 24 hr capsule 180 mg, 180 mg, Oral, Daily, Cleatus Delayne V, MD, 180 mg at 02/05/24 0920   enoxaparin  (LOVENOX ) injection 40 mg, 40 mg, Subcutaneous, QHS, Cleatus Delayne V, MD, 40 mg at 02/04/24 2115   fenofibrate  tablet 54 mg, 54 mg, Oral, Daily, Cleatus Delayne V, MD, 54 mg at 02/05/24 9078   folic acid  (FOLVITE ) tablet 1 mg, 1 mg, Oral, Daily, Cleatus Delayne V, MD, 1 mg at 02/05/24 0919   HYDROcodone -acetaminophen  (NORCO/VICODIN) 5-325 MG per tablet 1-2 tablet, 1-2 tablet, Oral, Q4H PRN, Cleatus Delayne GAILS, MD, 2 tablet at 02/05/24 1000   isosorbide  mononitrate (IMDUR ) 24 hr tablet  30 mg, 30 mg, Oral, Daily, Cleatus Delayne V, MD, 30 mg at 02/05/24 0920   lidocaine  (LIDODERM ) 5 % 2 patch, 2 patch, Transdermal, Q24H, Franchot Novel, MD, 2 patch at 02/04/24 1554   lisinopril  (ZESTRIL ) tablet 20 mg, 20 mg, Oral, Daily, Franchot Novel, MD, 20 mg at 02/05/24 9080   LORazepam  (ATIVAN ) injection 0-4 mg, 0-4 mg, Intravenous, Q12H **OR** LORazepam  (ATIVAN ) tablet 0-4 mg, 0-4 mg, Oral, Q12H, Siadecki, Waylon, MD, 1 mg at 02/05/24 1126   LORazepam  (ATIVAN ) injection 1-2 mg, 1-2 mg, Intravenous, Q1H PRN, Cleatus Delayne GAILS, MD, 2 mg at 02/05/24 9443   metoprolol  succinate (TOPROL -XL) 24 hr tablet 25 mg, 25 mg, Oral, Daily, Cleatus Delayne V, MD, 25 mg at 02/05/24 0919   multivitamin with minerals tablet 1 tablet, 1 tablet, Oral,  Daily, Cleatus Delayne GAILS, MD, 1 tablet at 02/05/24 9078   nicotine  (NICODERM CQ  - dosed in mg/24 hours) patch 14 mg, 14 mg, Transdermal, Daily, Franchot Novel, MD, 14 mg at 02/05/24 9078   ondansetron  (ZOFRAN ) tablet 4 mg, 4 mg, Oral, Q6H PRN **OR** ondansetron  (ZOFRAN ) injection 4 mg, 4 mg, Intravenous, Q6H PRN, Cleatus Delayne GAILS, MD   [EXPIRED] PHENObarbital  (LUMINAL) injection 65 mg, 65 mg, Intravenous, Q8H, 65 mg at 02/04/24 1305 **FOLLOWED BY** PHENObarbital  (LUMINAL) injection 32.5 mg, 32.5 mg, Intravenous, Q8H, Cleatus Delayne V, MD, 32.5 mg at 02/05/24 1240   rosuvastatin  (CRESTOR ) tablet 40 mg, 40 mg, Oral, Daily, Cleatus Delayne V, MD, 40 mg at 02/05/24 1126   thiamine  (VITAMIN B1) 200 mg in sodium chloride  0.9 % 50 mL IVPB, 200 mg, Intravenous, Q24H, Last Rate: 104 mL/hr at 02/03/24 2134, 200 mg at 02/03/24 2134 **OR** thiamine  (VITAMIN B1) tablet 100 mg, 100 mg, Oral, Q24H, Cleatus Delayne GAILS, MD, 100 mg at 02/04/24 2114  Allergies: Allergies  Allergen Reactions   Cefazolin  Anaphylaxis   Clindamycin  Hcl Anaphylaxis    Throat closing/swelling/difficulty breathing   Penicillins Swelling, Rash and Other (See Comments)    Reaction:  Tongue swelling  Has  patient had a PCN reaction causing immediate rash, facial/tongue/throat swelling, SOB or lightheadedness with hypotension: Yes Has patient had a PCN reaction causing severe rash involving mucus membranes or skin necrosis: No Has patient had a PCN reaction that required hospitalization No Has patient had a PCN reaction occurring within the last 10 years: No If all of the above answers are NO, then may proceed with Cephalosporin use.   [name], NP This note was created using Dragon dictation software. Please excuse any inadvertent transcription errors. Case was discussed with supervising physician Dr. Jadapalle who is agreeable with current plan.

## 2024-02-05 NOTE — Plan of Care (Signed)

## 2024-02-05 NOTE — Plan of Care (Signed)

## 2024-02-05 NOTE — Progress Notes (Signed)
 Reviewed discharge instructions with pt and gf. Both verbalize understanding. IV removed. Tele removed. PT has belongings. Referrals given to pt in discharge packet

## 2024-02-05 NOTE — Progress Notes (Signed)
 Mobility Specialist - Progress Note    02/05/24 0900  Mobility  Activity Ambulated with assistance;Stood at bedside;Dangled on edge of bed  Level of Assistance Independent after set-up  Assistive Device None  Distance Ambulated (ft) 170 ft  Range of Motion/Exercises Active;All extremities  Activity Response Tolerated well  Mobility visit 1 Mobility  Mobility Specialist Start Time (ACUTE ONLY) 0908  Mobility Specialist Stop Time (ACUTE ONLY) [id number]  Mobility Specialist Time Calculation (min) (ACUTE ONLY) 8 min   Pt was supine in bed with the HOB elevated on RA upon entry. Pt agreed to mobility. Pt is able today to get to the EOB independently with no bed features. Pt is able to STS independently with no AD. Pt ambulated well. Pt did have brief moments of instability due to LE fatigue. Pt did not need a recovery break throughout activity. After activity pt returned to the room back in bed with needs in reach.  Clem Rodes Mobility Specialist 02/05/2024, 9:45 AM

## 2024-02-05 NOTE — Discharge Summary (Signed)
 Physician Discharge Summary   Patient: Theodore Garcia MRN: [id number] DOB: Feb 08, 1971  Admit date:     02/02/2024  Discharge date: 02/05/2024  Discharge Physician: Alban Pepper   PCP: Valora Lynwood FALCON, MD   Recommendations at discharge:    F/u BMP in 1 week to ensure kidney function stable.  F/u With PCP prediabetes   Discharge Diagnoses: Principal Problem:   Alcohol withdrawal delirium, acute, hyperactive (HCC) Active Problems:   Coronary artery disease, nonobstructive   Hypertension   Chronic obstructive pulmonary disease (COPD) (HCC)   Anxiety  Resolved Problems:   * No resolved hospital problems. *  Hospital Course:  Per H&P HPI: Theodore Garcia is a 53 y.o. male with medical history significant for HTN, anxiety, COPD, nonobstructive CAD, alcohol use disorder, being admitted with acute alcohol withdrawal delirium, with unmanageable symptoms after multiple doses of Ativan  as well as Geodon  in the emergency room.  He was brought in by EMS with agitation, screaming and jerking sensations.  States that he drinks about 1/5 a day.  ICU was called to evaluate for admission but at the time of the assessment patient had started to improve with treatment and medicine admission requested. In the ED was hypertensive and tachycardic with otherwise normal vitals.  Labs notable for EtOH of 96, blood glucose 253 with anion gap 18 and bicarb 21. As mentioned, treated with Geodon  and Ativan . Admission requested   Assessment and Plan: * Alcohol withdrawal delirium, acute, hyperactive (HCC) Patient status post 5 doses of phenobarbital  65 mg and 3 doses of phenobarbital  32.5mg . His symptoms seemed improved with this. Patient does take long acting ativan  at home and his baseline anxiety could have been contributing to his  symptoms. He does note he drinks for pain management mostly.  Discussed importance of alcohol cessation especially with other centrally acting medications.  Patient will  discharge on Compazine.  Will give 5-day supply opioids for his  back pain and will send referral to chronic pain management.  He was also given resources for alcohol and substance use disorder.   Coronary artery disease, nonobstructive  Continue home aspirin , metoprolol , isosorbide , rosuvastatin .  Ranexa  was briefly held given interaction with phenobarbital .  This was resumed on discharge  Hypertension BP elevated in the setting of acute alcohol withdrawal His blood pressure normalized with resumption of his home medications as well as medications for alcohol withdrawal.    Chronic obstructive pulmonary disease (COPD) (HCC) Not acutely exacerbated DuoNebs as needed  Anxiety ADHD Patient's home Adderall and Ativan  were not continued on admission.  He was receiving as needed Ativan  as well as phenobarbital  for alcohol withdrawal symptoms.  His long-acting Ativan  was continued on discharge.  Patient will be given instructions to avoid alcohol while taking Ativan .  Discussed with patient that her provider if Adderall is contributing to his anxiety.  Prediabetes A1c 6.3, will need to follow-up with his primary care     Pain control - [place]  Controlled Substance Reporting System database was reviewed. and patient was instructed, not to drive, operate heavy machinery, perform activities at heights, swimming or participation in water activities or provide baby-sitting services while on Pain, Sleep and Anxiety Medications; until their outpatient Physician has advised to do so again. Also recommended to not to take more than prescribed Pain, Sleep and Anxiety Medications.  Consultants: Psychiatry  Procedures performed: [facility]One  Disposition: Home Diet recommendation:  Regular diet DISCHARGE MEDICATION: Allergies as of 02/05/2024       Reactions  Cefazolin  Anaphylaxis   Clindamycin  Hcl Anaphylaxis   Throat closing/swelling/difficulty breathing   Penicillins Swelling, Rash, Other (See  Comments)   Reaction:  Tongue swelling  Has patient had a PCN reaction causing immediate rash, facial/tongue/throat swelling, SOB or lightheadedness with hypotension: Yes Has patient had a PCN reaction causing severe rash involving mucus membranes or skin necrosis: No Has patient had a PCN reaction that required hospitalization No Has patient had a PCN reaction occurring within the last 10 years: No If all of the above answers are NO, then may proceed with Cephalosporin use.        Medication List     TAKE these medications    acamprosate 333 MG tablet Commonly known as: CAMPRAL Take 2 tablets (666 mg total) by mouth 3 (three) times daily with meals.   acetaminophen  325 MG tablet Commonly known as: TYLENOL  Take 2 tablets (650 mg total) by mouth every 6 (six) hours as needed for mild pain (pain score 1-3) or fever (or Fever >/= 101).   albuterol  108 (90 Base) MCG/ACT inhaler Commonly known as: VENTOLIN  HFA Inhale 2 puffs into the lungs every 6 (six) hours as needed for wheezing or shortness of breath.   ALPRAZolam  2 MG 24 hr tablet Commonly known as: XANAX  XR Take 2 mg by mouth daily. What changed: Another medication with the same name was removed. Continue taking this medication, and follow the directions you see here.   amphetamine -dextroamphetamine  10 MG tablet Commonly known as: ADDERALL Take 10 mg by mouth daily as needed (depends on work schedule).   amphetamine -dextroamphetamine  25 MG 24 hr capsule Commonly known as: ADDERALL XR Take 25 mg by mouth every morning.   aspirin  EC 81 MG tablet Take 81 mg by mouth daily. Swallow whole.   diltiazem  180 MG 24 hr capsule Commonly known as: CARDIZEM  CD Take 180 mg by mouth daily.   Dupixent 300 MG/2ML Soaj Generic drug: Dupilumab Inject 300 mg into the skin every 14 (fourteen) days.   EPINEPHrine  0.3 mg/0.3 mL Soaj injection Commonly known as: EPI-PEN Inject 0.3 mg into the muscle once as needed (for severe  allergic reaction).   fenofibrate  48 MG tablet Commonly known as: TRICOR  Take 48 mg by mouth daily.   isosorbide  mononitrate 30 MG 24 hr tablet Commonly known as: IMDUR  Take 1 tablet (30 mg total) by mouth daily.   lidocaine  5 % Commonly known as: LIDODERM  Place 1 patch onto the skin daily.   lisinopril  20 MG tablet Commonly known as: ZESTRIL  Take 20 mg by mouth daily.   metoprolol  succinate 25 MG 24 hr tablet Commonly known as: TOPROL -XL Take 25 mg by mouth daily.   omega-3 acid ethyl esters 1 g capsule Commonly known as: LOVAZA  Take 2 g by mouth daily.   oxyCODONE  5 MG immediate release tablet Commonly known as: Roxicodone  Take 0.5-1 tablets (2.5-5 mg total) by mouth every 8 (eight) hours as needed for up to 5 days for severe pain (pain score 7-10).   ranolazine  500 MG 12 hr tablet Commonly known as: RANEXA  Take 500 mg by mouth daily. What changed: Another medication with the same name was removed. Continue taking this medication, and follow the directions you see here.   rosuvastatin  40 MG tablet Commonly known as: CRESTOR  Take 40 mg by mouth daily.   tiZANidine 2 MG tablet Commonly known as: ZANAFLEX Take 2 mg by mouth every 8 (eight) hours as needed for muscle spasms.   Vitamin D (Ergocalciferol) 1.25 MG (50000 UNIT)  Caps capsule Commonly known as: DRISDOL Take 50,000 Units by mouth every 7 (seven) days.        Discharge Exam: Filed Weights   02/02/24 1320  Weight: 99.8 kg   Physical Exam  Constitutional: In no distress.  Cardiovascular: Normal rate, regular rhythm. No lower extremity edema  Pulmonary: Non labored breathing on room air, no wheezing or rales.   Abdominal: Soft. Non distended and non tender Musculoskeletal: Normal range of motion.     Neurological: Alert and oriented to person, place, and time. Non focal  Skin: Skin is warm and dry.    Condition at discharge: stable  The results of significant diagnostics from this  hospitalization (including imaging, microbiology, ancillary and laboratory) are listed below for reference.   Imaging Studies: No results found.  Microbiology: Results for orders placed or performed during the hospital encounter of 11/07/19  SARS CORONAVIRUS 2 (TAT 6-24 HRS) Nasopharyngeal Nasopharyngeal Swab     Status: None   Collection Time: 11/07/19  3:19 PM   Specimen: Nasopharyngeal Swab  Result Value Ref Range Status   SARS Coronavirus 2 NEGATIVE NEGATIVE Final    Comment: (NOTE) SARS-CoV-2 target nucleic acids are NOT DETECTED.  The SARS-CoV-2 RNA is generally detectable in upper and lower respiratory specimens during the acute phase of infection. Negative results do not preclude SARS-CoV-2 infection, do not rule out co-infections with other pathogens, and should not be used as the sole basis for treatment or other patient management decisions. Negative results must be combined with clinical observations, patient history, and epidemiological information. The expected result is Negative.  Fact Sheet for Patients: hairslick.no  Fact Sheet for Healthcare Providers: quierodirigir.com  This test is not yet approved or cleared by the [place]  FDA and  has been authorized for detection and/or diagnosis of SARS-CoV-2 by FDA under an Emergency Use Authorization (EUA). This EUA will remain  in effect (meaning this test can be used) for the duration of the COVID-19 declaration under Se ction 564(b)(1) of the Act, 21 U.S.C. section 360bbb-3(b)(1), unless the authorization is terminated or revoked sooner.  Performed at [facility], [address] [place], [place] [address]     Labs: CBC: Recent Labs  Lab 02/02/24 1332 02/04/24 0521  WBC 9.6 9.7  NEUTROABS 5.2  --   HGB 14.3 14.0  HCT 41.8 41.4  MCV 98.4 99.3  PLT 234 213   Basic Metabolic Panel: Recent Labs  Lab 02/02/24 1332 02/04/24 0521  02/05/24 0410  NA 136 133* 134*  K 4.2 3.9 3.9  CL 98 96* 98  CO2 21* 25 24  GLUCOSE 253* 105* 122*  BUN 25* 20 22*  CREATININE 0.85 0.92 1.08  CALCIUM  9.6 9.2 9.7   Liver Function Tests: Recent Labs  Lab 02/02/24 1332  AST 28  ALT 32  ALKPHOS 95  BILITOT 0.3  PROT 7.5  ALBUMIN 4.5   CBG: No results for input(s): GLUCAP in the last 168 hours.  Discharge time spent: greater than 30 minutes.  Signed: Alban Pepper, MD [facility] 02/05/2024

## 2024-02-05 NOTE — Progress Notes (Signed)
 Pt ambulating in hallway with ambulation team in am. Pt was refusing to wear shoes or socks. Educated pt on the safety and reason to why we need to wear socks or shoes, stated he will put them on next time he walks in the hallways but not in his room.

## 2024-02-05 NOTE — Plan of Care (Signed)
°  Problem: Education: Goal: Knowledge of General Education information will improve Description: Including pain rating scale, medication(s)/side effects and non-pharmacologic comfort measures 02/05/2024 1656 by Hollye Pritt K, RN Outcome: Adequate for Discharge 02/05/2024 1032 by Freeda Leon POUR, RN Outcome: Progressing   Problem: Health Behavior/Discharge Planning: Goal: Ability to manage health-related needs will improve 02/05/2024 1656 by Davontae Prusinski K, RN Outcome: Adequate for Discharge 02/05/2024 1032 by Freeda Leon POUR, RN Outcome: Progressing   Problem: Clinical Measurements: Goal: Ability to maintain clinical measurements within normal limits will improve 02/05/2024 1656 by [place] Baldwin K, RN Outcome: Adequate for Discharge 02/05/2024 1032 by Freeda Leon POUR, RN Outcome: Progressing Goal: Will remain free from infection Outcome: Adequate for Discharge Goal: Diagnostic test results will improve Outcome: Adequate for Discharge Goal: Respiratory complications will improve Outcome: Adequate for Discharge Goal: Cardiovascular complication will be avoided Outcome: Adequate for Discharge   Problem: Activity: Goal: Risk for activity intolerance will decrease Outcome: Adequate for Discharge   Problem: Nutrition: Goal: Adequate nutrition will be maintained Outcome: Adequate for Discharge   Problem: Coping: Goal: Level of anxiety will decrease Outcome: Adequate for Discharge   Problem: Elimination: Goal: Will not experience complications related to bowel motility Outcome: Adequate for Discharge Goal: Will not experience complications related to urinary retention Outcome: Adequate for Discharge   Problem: Pain Managment: Goal: General experience of comfort will improve and/or be controlled Outcome: Adequate for Discharge   Problem: Safety: Goal: Ability to remain free from injury will improve Outcome: Adequate for Discharge   Problem: Skin  Integrity: Goal: Risk for impaired skin integrity will decrease Outcome: Adequate for Discharge

## 2024-03-24 ENCOUNTER — Other Ambulatory Visit: Payer: Self-pay | Admitting: Internal Medicine

## 2024-03-24 DIAGNOSIS — R0602 Shortness of breath: Secondary | ICD-10-CM

## 2024-03-24 DIAGNOSIS — R0789 Other chest pain: Secondary | ICD-10-CM
